# Patient Record
Sex: Male | Born: 1965 | Race: White | Hispanic: No | Marital: Married | Smoking: Current every day smoker
Health system: Southern US, Community
[De-identification: ages and names within clinical notes are randomized; demographics above are authoritative.]

## PROBLEM LIST (undated history)

## (undated) DIAGNOSIS — E785 Hyperlipidemia, unspecified: Secondary | ICD-10-CM

## (undated) DIAGNOSIS — I739 Peripheral vascular disease, unspecified: Secondary | ICD-10-CM

## (undated) DIAGNOSIS — G8929 Other chronic pain: Secondary | ICD-10-CM

## (undated) DIAGNOSIS — J45909 Unspecified asthma, uncomplicated: Secondary | ICD-10-CM

## (undated) DIAGNOSIS — M549 Dorsalgia, unspecified: Secondary | ICD-10-CM

## (undated) DIAGNOSIS — M797 Fibromyalgia: Secondary | ICD-10-CM

## (undated) DIAGNOSIS — G709 Myoneural disorder, unspecified: Secondary | ICD-10-CM

## (undated) DIAGNOSIS — M1990 Unspecified osteoarthritis, unspecified site: Secondary | ICD-10-CM

## (undated) HISTORY — DX: Unspecified asthma, uncomplicated: J45.909

## (undated) HISTORY — PX: TONSILLECTOMY: SUR1361

## (undated) HISTORY — DX: Unspecified osteoarthritis, unspecified site: M19.90

## (undated) HISTORY — DX: Myoneural disorder, unspecified: G70.9

## (undated) HISTORY — DX: Hyperlipidemia, unspecified: E78.5

## (undated) HISTORY — DX: Fibromyalgia: M79.7

## (undated) HISTORY — DX: Peripheral vascular disease, unspecified: I73.9

---

## 2015-09-06 DIAGNOSIS — E785 Hyperlipidemia, unspecified: Secondary | ICD-10-CM

## 2015-09-06 HISTORY — DX: Hyperlipidemia, unspecified: E78.5

## 2016-12-09 DIAGNOSIS — Z79899 Other long term (current) drug therapy: Secondary | ICD-10-CM | POA: Diagnosis not present

## 2016-12-09 DIAGNOSIS — M797 Fibromyalgia: Secondary | ICD-10-CM | POA: Diagnosis not present

## 2016-12-09 DIAGNOSIS — E785 Hyperlipidemia, unspecified: Secondary | ICD-10-CM | POA: Diagnosis not present

## 2016-12-09 DIAGNOSIS — I739 Peripheral vascular disease, unspecified: Secondary | ICD-10-CM

## 2016-12-09 HISTORY — DX: Peripheral vascular disease, unspecified: I73.9

## 2016-12-11 DIAGNOSIS — I739 Peripheral vascular disease, unspecified: Secondary | ICD-10-CM | POA: Diagnosis not present

## 2016-12-22 ENCOUNTER — Other Ambulatory Visit: Payer: Self-pay

## 2016-12-22 DIAGNOSIS — R6889 Other general symptoms and signs: Secondary | ICD-10-CM

## 2016-12-22 DIAGNOSIS — I739 Peripheral vascular disease, unspecified: Secondary | ICD-10-CM

## 2016-12-25 ENCOUNTER — Encounter: Payer: Self-pay | Admitting: Vascular Surgery

## 2017-02-10 ENCOUNTER — Encounter: Payer: Self-pay | Admitting: Vascular Surgery

## 2017-02-18 ENCOUNTER — Encounter (HOSPITAL_COMMUNITY): Payer: BLUE CROSS/BLUE SHIELD

## 2017-02-18 ENCOUNTER — Encounter: Payer: BLUE CROSS/BLUE SHIELD | Admitting: Vascular Surgery

## 2017-02-19 ENCOUNTER — Ambulatory Visit (INDEPENDENT_AMBULATORY_CARE_PROVIDER_SITE_OTHER): Payer: BLUE CROSS/BLUE SHIELD | Admitting: Vascular Surgery

## 2017-02-19 ENCOUNTER — Ambulatory Visit (INDEPENDENT_AMBULATORY_CARE_PROVIDER_SITE_OTHER)
Admission: RE | Admit: 2017-02-19 | Discharge: 2017-02-19 | Disposition: A | Discharge status: Home / Self Care | Payer: BLUE CROSS/BLUE SHIELD | Source: Ambulatory Visit | Attending: Vascular Surgery | Admitting: Vascular Surgery

## 2017-02-19 ENCOUNTER — Encounter: Payer: Self-pay | Admitting: Vascular Surgery

## 2017-02-19 ENCOUNTER — Ambulatory Visit (HOSPITAL_COMMUNITY)
Admission: RE | Admit: 2017-02-19 | Discharge: 2017-02-19 | Disposition: A | Discharge status: Home / Self Care | Payer: BLUE CROSS/BLUE SHIELD | Source: Ambulatory Visit | Attending: Vascular Surgery | Admitting: Vascular Surgery

## 2017-02-19 ENCOUNTER — Other Ambulatory Visit: Payer: Self-pay

## 2017-02-19 VITALS — BP 112/71 | HR 53 | Temp 97.5°F | Resp 16 | Ht 71.0 in | Wt 159.0 lb

## 2017-02-19 DIAGNOSIS — I739 Peripheral vascular disease, unspecified: Secondary | ICD-10-CM

## 2017-02-19 DIAGNOSIS — R6889 Other general symptoms and signs: Secondary | ICD-10-CM

## 2017-02-19 DIAGNOSIS — I771 Stricture of artery: Secondary | ICD-10-CM | POA: Insufficient documentation

## 2017-02-19 LAB — VAS US LOWER EXTREMITY ARTERIAL DUPLEX
LPTIBDISTSYS: 20 cm/s
Left ant tibial distal sys: 26 cm/s
Left super femoral dist sys PSV: -75 cm/s
Left super femoral mid sys PSV: 88 cm/s
Left super femoral prox sys PSV: -68 cm/s
RATIBDISTSYS: 14 cm/s
RIGHT POST TIB DIST SYS: 14 cm/s
RSFDPSV: -30 cm/s
RSFMPSV: 572 cm/s
Right super femoral prox sys PSV: -55 cm/s

## 2017-02-19 NOTE — Progress Notes (Addendum)
Referring Physician: Eunice BlaseGreta O'Buch PA  [name] Mccrackin MRN: [id number] DOB: [birth year] Sex: male  REASON FOR CONSULT: Bilateral leg pain  HPI: Tommy Larson Shippy is a 51 y.o. male with a 3 year history of bilateral lower extremity cramping type pain in the calf. Patient states his symptoms are brought on by walking. After he walks about 100 feet he develops this pain. It is relieved after about 5 minutes of rest. He currently smokes 1 pack of cigarettes per day. Greater than 3 minutes today were spent regarding smoking cessation counseling. He states that he is discussing with his primary care physician the possibility of starting Chantix but has not decided to start this yet. He currently is not on aspirin. He denies rest pain in his feet. He has no history of nonhealing wounds. He is on Lipitor.  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  Past Medical History:  Diagnosis Date  . Arthritis   . Asthma   . Claudication (HCC) 12/09/2016   Bilateral LE  . Dyslipidemia 09/06/2015  . Fibromyalgia   . Neuromuscular disorder (HCC)    neuropathy   No past surgical history on file.  Family History  Problem Relation Age of Onset  . Asthma Mother   . Arthritis Mother   . Arthritis Sister     SOCIAL HISTORY: Social History   Social History  . Marital status: Unknown    Spouse name: N/A  . Number of children: N/A  . Years of education: N/A   Occupational History  . Not on file.   Social History Main Topics  . Smoking status: Current Every Day Smoker    Packs/day: 1.00  . Smokeless tobacco: Never Used  . Alcohol use No  . Drug use: No  . Sexual activity: Not on file   Other Topics Concern  . Not on file   Social History Narrative  . No narrative on file    No Known Allergies  Current Outpatient Prescriptions  Medication Sig Dispense Refill  . atorvastatin (LIPITOR) 20 MG tablet Take 20 mg by mouth  daily.    Marland Kitchen. gabapentin (NEURONTIN) 600 MG tablet Take 600 mg by mouth 3 (three) times daily.    . meloxicam (MOBIC) 15 MG tablet Take 15 mg by mouth daily.    . traMADol (ULTRAM) 50 MG tablet Take by mouth every 6 (six) hours as needed.     No current facility-administered medications for this visit.     ROS:   General:  No weight loss, Fever, chills  HEENT: No recent headaches, no nasal bleeding, no visual changes, no sore throat  Neurologic: No dizziness, blackouts, seizures. No recent symptoms of stroke or mini- stroke. No recent episodes of slurred speech, or temporary blindness.  Cardiac: No recent episodes of chest pain/pressure, no shortness of breath at rest.  No shortness of breath with exertion.  Denies history of atrial fibrillation or irregular heartbeat  Vascular: No history of rest pain in feet.  + history of claudication.  No history of non-healing ulcer, No history of DVT   Pulmonary: No home oxygen, no productive cough, no hemoptysis,  No asthma or wheezing  Musculoskeletal:  [ ]  Arthritis, [ ]  Low back pain,  [ ]  Joint pain  Hematologic:No history of hypercoagulable state.  No history of easy bleeding.  No history of anemia  Gastrointestinal: No hematochezia or melena,  No gastroesophageal reflux,  no trouble swallowing  Urinary: [ ]  chronic Kidney disease, [ ]  on HD - [ ]  MWF or [ ]  TTHS, [ ]  Burning with urination, [ ]  Frequent urination, [ ]  Difficulty urinating;   Skin: No rashes  Psychological: No history of anxiety,  No history of depression   Physical Examination  Vitals:   02/19/17 0943  BP: 112/71  Pulse: (!) 53  Resp: 16  Temp: (!) 97.5 F (36.4 C)  TempSrc: Oral  SpO2: 97%  Weight: 159 lb (72.1 kg)  Height: 5\' 11"  (1.803 m)    Body mass index is 22.18 kg/m.  General:  Alert and oriented, no acute distress HEENT: Normal Neck: No bruit or JVD Pulmonary: Clear to auscultation bilaterally Cardiac: Regular Rate and Rhythm without  murmur Abdomen: Soft, non-tender, non-distended, no mass Skin: No rash Extremity Pulses:  2+ radial, brachial,1+ femoral, absent popliteal dorsalis pedis, posterior tibial pulses bilaterally Musculoskeletal: No deformity or edema  Neurologic: Upper and lower extremity motor 5/5 and symmetric  DATA:  Patient had [facility]. We have sent out to get those results but they were not available for review today. The patient had lower extremity duplex exam performed at our office today. This showed monophasic waveforms diffusely in both legs. He had a right external iliac artery stenosis. He had mild stenosis of the left iliac system as well. He also had evidence of a mid right superficial femoral artery stenosis.  ASSESSMENT:  Bilateral lower extremity claudication. Discussed the patient today walking program of 30 minutes daily. Also discussed with him today smoking cessation. I emphasized to him that if he is able to quit smoking his lifetime risk of amputation is less than 5%. However I did emphasize to him that if he continues to smoke he would be at risk of limb loss long-term. I also discussed with him the possibility of aortogram lower extremity runoff possible intervention to improve his symptoms. I did discuss with him that this would be for lifestyle related problems and that he currently is not at risk of limb loss. Risks benefits possible complications and procedure details including but not limited to bleeding infection vessel injury contrast reaction were explained the patient today. He understands and wishes to proceed with arteriography because he feels like he is unable to perform his job as well by not being able to walk certain distances.   PLAN:  #1 Will add aspirin daily to patient's medication regimen. Emphasized to him that this is to prevent myocardial infarction or stroke with the presence of peripheral arterial disease.  Again emphasized the  patient to quit smoking. I offered to write a prescription for Chantix today but he wishes to think about this more for now.  Abdominal aortogram lower extremity runoff possible intervention scheduled for 02/27/2017. Patient understands that if his occlusive disease is extensive and not amenable to angioplasty or stenting he may need to consider operative repair which will be scheduled at an additional date   Fabienne Brunsharles Fields, MD [facility] Office: [phone] Pager: [phone]  Addendum: 02/27/17  0.8 bilateral suggestive of iliac inflow disease from [facility]

## 2017-02-23 ENCOUNTER — Encounter: Payer: Self-pay | Admitting: Internal Medicine

## 2017-02-27 ENCOUNTER — Encounter (HOSPITAL_COMMUNITY): Payer: Self-pay | Admitting: *Deleted

## 2017-02-27 ENCOUNTER — Encounter (HOSPITAL_COMMUNITY)
Admission: RE | Disposition: A | Discharge status: Home / Self Care | Payer: Self-pay | Source: Ambulatory Visit | Attending: Vascular Surgery

## 2017-02-27 ENCOUNTER — Observation Stay (HOSPITAL_COMMUNITY)
Admission: RE | Admit: 2017-02-27 | Discharge: 2017-02-28 | Disposition: A | Discharge status: Home / Self Care | Payer: BLUE CROSS/BLUE SHIELD | Source: Ambulatory Visit | Attending: Vascular Surgery | Admitting: Vascular Surgery

## 2017-02-27 ENCOUNTER — Other Ambulatory Visit: Payer: Self-pay | Admitting: *Deleted

## 2017-02-27 ENCOUNTER — Ambulatory Visit (HOSPITAL_COMMUNITY): Payer: BLUE CROSS/BLUE SHIELD

## 2017-02-27 DIAGNOSIS — G8929 Other chronic pain: Secondary | ICD-10-CM | POA: Insufficient documentation

## 2017-02-27 DIAGNOSIS — Y838 Other surgical procedures as the cause of abnormal reaction of the patient, or of later complication, without mention of misadventure at the time of the procedure: Secondary | ICD-10-CM | POA: Diagnosis not present

## 2017-02-27 DIAGNOSIS — I97638 Postprocedural hematoma of a circulatory system organ or structure following other circulatory system procedure: Secondary | ICD-10-CM | POA: Diagnosis not present

## 2017-02-27 DIAGNOSIS — M1990 Unspecified osteoarthritis, unspecified site: Secondary | ICD-10-CM | POA: Insufficient documentation

## 2017-02-27 DIAGNOSIS — F17210 Nicotine dependence, cigarettes, uncomplicated: Secondary | ICD-10-CM | POA: Insufficient documentation

## 2017-02-27 DIAGNOSIS — M797 Fibromyalgia: Secondary | ICD-10-CM | POA: Diagnosis not present

## 2017-02-27 DIAGNOSIS — G629 Polyneuropathy, unspecified: Secondary | ICD-10-CM | POA: Diagnosis not present

## 2017-02-27 DIAGNOSIS — E785 Hyperlipidemia, unspecified: Secondary | ICD-10-CM | POA: Diagnosis not present

## 2017-02-27 DIAGNOSIS — I739 Peripheral vascular disease, unspecified: Secondary | ICD-10-CM | POA: Diagnosis present

## 2017-02-27 DIAGNOSIS — Z7982 Long term (current) use of aspirin: Secondary | ICD-10-CM | POA: Diagnosis not present

## 2017-02-27 DIAGNOSIS — I70213 Atherosclerosis of native arteries of extremities with intermittent claudication, bilateral legs: Principal | ICD-10-CM | POA: Insufficient documentation

## 2017-02-27 DIAGNOSIS — R1900 Intra-abdominal and pelvic swelling, mass and lump, unspecified site: Secondary | ICD-10-CM | POA: Diagnosis not present

## 2017-02-27 DIAGNOSIS — J45909 Unspecified asthma, uncomplicated: Secondary | ICD-10-CM | POA: Diagnosis not present

## 2017-02-27 DIAGNOSIS — M549 Dorsalgia, unspecified: Secondary | ICD-10-CM | POA: Insufficient documentation

## 2017-02-27 HISTORY — PX: ILIAC ARTERY STENT: SHX1786

## 2017-02-27 HISTORY — DX: Peripheral vascular disease, unspecified: I73.9

## 2017-02-27 HISTORY — PX: ABDOMINAL AORTOGRAM W/LOWER EXTREMITY: CATH118223

## 2017-02-27 HISTORY — DX: Dorsalgia, unspecified: M54.9

## 2017-02-27 HISTORY — PX: PERIPHERAL VASCULAR INTERVENTION: CATH118257

## 2017-02-27 HISTORY — DX: Other chronic pain: G89.29

## 2017-02-27 LAB — POCT ACTIVATED CLOTTING TIME: Activated Clotting Time: 153 seconds

## 2017-02-27 LAB — CBC
HEMATOCRIT: 38.8 % — AB (ref 39.0–52.0)
HEMOGLOBIN: 13.7 g/dL (ref 13.0–17.0)
MCH: 32.5 pg (ref 26.0–34.0)
MCHC: 35.3 g/dL (ref 30.0–36.0)
MCV: 92.2 fL (ref 78.0–100.0)
Platelets: 186 10*3/uL (ref 150–400)
RBC: 4.21 MIL/uL — AB (ref 4.22–5.81)
RDW: 13.8 % (ref 11.5–15.5)
WBC: 15.4 10*3/uL — ABNORMAL HIGH (ref 4.0–10.5)

## 2017-02-27 LAB — POCT I-STAT, CHEM 8
BUN: 16 mg/dL (ref 6–20)
CALCIUM ION: 1.14 mmol/L — AB (ref 1.15–1.40)
CHLORIDE: 104 mmol/L (ref 101–111)
CREATININE: 0.8 mg/dL (ref 0.61–1.24)
GLUCOSE: 103 mg/dL — AB (ref 65–99)
HCT: 42 % (ref 39.0–52.0)
Hemoglobin: 14.3 g/dL (ref 13.0–17.0)
Potassium: 4 mmol/L (ref 3.5–5.1)
SODIUM: 139 mmol/L (ref 135–145)
TCO2: 26 mmol/L (ref 22–32)

## 2017-02-27 LAB — TYPE AND SCREEN
ABO/RH(D): A POS
Antibody Screen: NEGATIVE

## 2017-02-27 LAB — ABO/RH: ABO/RH(D): A POS

## 2017-02-27 SURGERY — ABDOMINAL AORTOGRAM W/LOWER EXTREMITY
Anesthesia: LOCAL

## 2017-02-27 MED ORDER — CLOPIDOGREL BISULFATE 75 MG PO TABS: 75.0000 mg | ORAL_TABLET | Freq: Every day | ORAL | Status: DC

## 2017-02-27 MED ORDER — IODIXANOL 320 MG/ML IV SOLN
INTRAVENOUS | Status: DC | PRN
  Administered 2017-02-27: 170 mL via INTRA_ARTERIAL

## 2017-02-27 MED ORDER — PNEUMOCOCCAL VAC POLYVALENT 25 MCG/0.5ML IJ INJ: 0.5000 mL | INJECTION | INTRAMUSCULAR | Status: DC

## 2017-02-27 MED ORDER — HEPARIN (PORCINE) IN NACL 2-0.9 UNIT/ML-% IJ SOLN
INTRAMUSCULAR | Status: AC | PRN
  Administered 2017-02-27: 1000 mL

## 2017-02-27 MED ORDER — CLOPIDOGREL BISULFATE 75 MG PO TABS: 75.0000 mg | ORAL_TABLET | Freq: Every day | ORAL | 11 refills | Status: DC

## 2017-02-27 MED ORDER — SODIUM CHLORIDE 0.9 % IV SOLN
INTRAVENOUS | Status: DC
  Administered 2017-02-27: 08:00:00 via INTRAVENOUS

## 2017-02-27 MED ORDER — ALUM & MAG HYDROXIDE-SIMETH 200-200-20 MG/5ML PO SUSP
15.0000 mL | ORAL | Status: DC | PRN
  Filled 2017-02-27: qty 30

## 2017-02-27 MED ORDER — ONDANSETRON HCL 4 MG/2ML IJ SOLN: 4.0000 mg | Freq: Four times a day (QID) | INTRAMUSCULAR | Status: DC | PRN

## 2017-02-27 MED ORDER — LABETALOL HCL 5 MG/ML IV SOLN: 10.0000 mg | INTRAVENOUS | Status: DC | PRN

## 2017-02-27 MED ORDER — HYDRALAZINE HCL 20 MG/ML IJ SOLN: 5.0000 mg | INTRAMUSCULAR | Status: DC | PRN

## 2017-02-27 MED ORDER — POTASSIUM CHLORIDE CRYS ER 20 MEQ PO TBCR: 20.0000 meq | EXTENDED_RELEASE_TABLET | Freq: Once | ORAL | Status: DC

## 2017-02-27 MED ORDER — FENTANYL CITRATE (PF) 100 MCG/2ML IJ SOLN
INTRAMUSCULAR | Status: AC
  Filled 2017-02-27: qty 2

## 2017-02-27 MED ORDER — SODIUM CHLORIDE 0.9 % IV SOLN: 250.0000 mL | INTRAVENOUS | Status: DC | PRN

## 2017-02-27 MED ORDER — SODIUM CHLORIDE 0.9% FLUSH: 3.0000 mL | INTRAVENOUS | Status: DC | PRN

## 2017-02-27 MED ORDER — SODIUM CHLORIDE 0.9 % IV SOLN: INTRAVENOUS | Status: AC

## 2017-02-27 MED ORDER — METOPROLOL TARTRATE 5 MG/5ML IV SOLN: 2.0000 mg | INTRAVENOUS | Status: DC | PRN

## 2017-02-27 MED ORDER — GUAIFENESIN-DM 100-10 MG/5ML PO SYRP
15.0000 mL | ORAL_SOLUTION | ORAL | Status: DC | PRN
  Filled 2017-02-27: qty 15

## 2017-02-27 MED ORDER — MORPHINE SULFATE (PF) 2 MG/ML IV SOLN: 2.0000 mg | INTRAVENOUS | Status: DC | PRN

## 2017-02-27 MED ORDER — OXYCODONE-ACETAMINOPHEN 5-325 MG PO TABS
1.0000 | ORAL_TABLET | ORAL | Status: DC | PRN
  Administered 2017-02-27 – 2017-02-28 (×2): 1 via ORAL
  Filled 2017-02-27: qty 1

## 2017-02-27 MED ORDER — ASPIRIN EC 325 MG PO TBEC
325.0000 mg | DELAYED_RELEASE_TABLET | Freq: Every day | ORAL | Status: DC
  Administered 2017-02-28: 325 mg via ORAL
  Filled 2017-02-27 (×2): qty 1

## 2017-02-27 MED ORDER — OXYCODONE HCL 5 MG PO TABS: 5.0000 mg | ORAL_TABLET | ORAL | Status: DC | PRN

## 2017-02-27 MED ORDER — HEPARIN SODIUM (PORCINE) 1000 UNIT/ML IJ SOLN
INTRAMUSCULAR | Status: AC
  Filled 2017-02-27: qty 1

## 2017-02-27 MED ORDER — OXYCODONE-ACETAMINOPHEN 5-325 MG PO TABS
ORAL_TABLET | ORAL | Status: AC
  Filled 2017-02-27: qty 1

## 2017-02-27 MED ORDER — CLOPIDOGREL BISULFATE 300 MG PO TABS: 300.0000 mg | ORAL_TABLET | Freq: Once | ORAL | Status: DC

## 2017-02-27 MED ORDER — SODIUM CHLORIDE 0.9 % IV SOLN: Freq: Once | INTRAVENOUS | Status: DC

## 2017-02-27 MED ORDER — TETRACAINE HCL 1 % IJ SOLN
150.0000 mg | Freq: Once | INTRAMUSCULAR | Status: AC
  Administered 2017-02-27: 15 mL
  Filled 2017-02-27: qty 16

## 2017-02-27 MED ORDER — HEPARIN SODIUM (PORCINE) 1000 UNIT/ML IJ SOLN
INTRAMUSCULAR | Status: DC | PRN
  Administered 2017-02-27: 7000 [IU] via INTRAVENOUS

## 2017-02-27 MED ORDER — SODIUM CHLORIDE 0.9% FLUSH
3.0000 mL | Freq: Two times a day (BID) | INTRAVENOUS | Status: DC
  Administered 2017-02-27: 3 mL via INTRAVENOUS

## 2017-02-27 MED ORDER — MIDAZOLAM HCL 2 MG/2ML IJ SOLN
INTRAMUSCULAR | Status: DC | PRN
  Administered 2017-02-27: 1 mg via INTRAVENOUS

## 2017-02-27 MED ORDER — FENTANYL CITRATE (PF) 100 MCG/2ML IJ SOLN
INTRAMUSCULAR | Status: DC | PRN
  Administered 2017-02-27: 25 ug via INTRAVENOUS

## 2017-02-27 MED ORDER — HEPARIN (PORCINE) IN NACL 2-0.9 UNIT/ML-% IJ SOLN
INTRAMUSCULAR | Status: AC
  Filled 2017-02-27: qty 500

## 2017-02-27 MED ORDER — CLOPIDOGREL BISULFATE 75 MG PO TABS
75.0000 mg | ORAL_TABLET | Freq: Every day | ORAL | Status: DC
  Administered 2017-02-28: 75 mg via ORAL
  Filled 2017-02-27: qty 1

## 2017-02-27 MED ORDER — MORPHINE SULFATE (PF) 10 MG/ML IV SOLN: 2.0000 mg | INTRAVENOUS | Status: DC | PRN

## 2017-02-27 MED ORDER — PHENOL 1.4 % MT LIQD
1.0000 | OROMUCOSAL | Status: DC | PRN
  Filled 2017-02-27: qty 177

## 2017-02-27 MED ORDER — MIDAZOLAM HCL 2 MG/2ML IJ SOLN
INTRAMUSCULAR | Status: AC
  Filled 2017-02-27: qty 2

## 2017-02-27 MED ORDER — PANTOPRAZOLE SODIUM 40 MG PO TBEC
40.0000 mg | DELAYED_RELEASE_TABLET | Freq: Every day | ORAL | Status: DC
  Administered 2017-02-27 – 2017-02-28 (×2): 40 mg via ORAL
  Filled 2017-02-27 (×2): qty 1

## 2017-02-27 SURGICAL SUPPLY — 17 items
BALLN ARMADA 6X40X80 (BALLOONS) ×3
BALLOON ARMADA 6X40X80 (BALLOONS) ×2 IMPLANT
CATH CROSS OVER TEMPO 5F (CATHETERS) ×3 IMPLANT
CATH OMNI FLUSH 5F 65CM (CATHETERS) ×6 IMPLANT
COVER PRB 48X5XTLSCP FOLD TPE (BAG) ×2 IMPLANT
COVER PROBE 5X48 (BAG) ×1
GUIDEWIRE ANGLED .035X150CM (WIRE) ×3 IMPLANT
KIT ENCORE 26 ADVANTAGE (KITS) ×3 IMPLANT
KIT PV (KITS) ×3 IMPLANT
SHEATH PINNACLE 5F 10CM (SHEATH) ×3 IMPLANT
SHEATH PINNACLE ST 7F 45CM (SHEATH) ×3 IMPLANT
STENT ABSOLUTE PRO 6X40X135 (Permanent Stent) ×3 IMPLANT
SYR MEDRAD MARK V 150ML (SYRINGE) ×3 IMPLANT
TRANSDUCER W/STOPCOCK (MISCELLANEOUS) ×3 IMPLANT
TRAY PV CATH (CUSTOM PROCEDURE TRAY) ×3 IMPLANT
WIRE HITORQ VERSACORE ST 145CM (WIRE) ×3 IMPLANT
WIRE ROSEN-J .035X260CM (WIRE) ×3 IMPLANT

## 2017-02-27 NOTE — Op Note (Signed)
Procedure: Abdominal aortogram with bilateral lower extremity runoff, left external iliac stent (self-expanding 6 x 40 mm)  Preoperative diagnosis: Claudication. 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 The patient placed in supine position the Angio table. Both groins were prepped and draped in usual sterile fashion. Ultrasound was used to identify the right common femoral artery. Tetracaine was injected in the subcutaneous tissues to provide local anesthesia since the patient had previously had a side effect from lidocaine. After injecting local anesthesia ultrasound was used to cannulate the right common femoral artery with an introducer needle. An 035 versacore wire was then threaded up the abdominal aorta under fluoroscopic guidance. A 5 French sheath was divided guidewire the right common femoral artery. This was thoroughly flushed with heparinized saline. A 5 French Omni Flush catheter was advanced over the guidewire into the abdominal aorta. An abdominal aortogram was obtained in AP projection. The left and right renal arteries are widely patent. The infrarenal bowel aortic widely patent. The left and right common iliac arteries are widely patent. The left internal iliac artery is occluded. The right internal iliac artery is patent. The right external iliac artery has a 70% narrowing just above the inguinal ligament. There are similar findings in the left external iliac artery. These findings were confirmed by performing bilateral oblique views of the pelvis  after pulling the catheter down just above the aortic bifurcation.  At this point bilateral extremity runoff views were obtained.  In the left lower externa, the left common femoral artery profunda femoris and superficial femoral arteries are widely patent. There is a 90% stenosis over less than 1 cm length in the left popliteal artery. There is three-vessel runoff to the left foot.  In the right lower extremity, the right common femoral artery is widely patent. The right profunda femoris and superficial femoral arteries are patent. However there is a 90% stenosis in the distal right SFA over less than 1; length. The popliteal artery is patent. There is three-vessel runoff to the right foot.  At this point it was decided to intervene on the left external iliac artery lesion. A 5 French crossover catheter was placed over the versacore wire and this was then used to selectively catheterize the left common iliac artery. An 035 angled Glidewire was advanced across the lesion the patient was given 7000 units of intravenous heparin. The Glidewire was advanced down into the proximal left superficial femoral artery. The crossover catheter was then advanced over this. The Glidewire was then exchanged for an [place] wire. A 7 French destination sheath was then advanced up and over the aortic bifurcation into the distal left common iliac artery. Contrast angiogram was performed to confirm the level of the stent as well as make diameter and length measurements. A 6 x 40 mm self expanding stent was selected and this was deployed at the area of narrowing with the distal aspect of the stent extending down to but not encroaching upon the acetabulum area of the hip. This was then postdilated with a 640 balloon to 6 atm for 1 minute. Completion arteriogram showed widely patent  left external iliac artery with 0 residual stenosis. At this point the sheath was pulled back over the aortic bifurcation into the right hemipelvis  left in place to be pulled after the ACT is less than 175. The patient tolerated the procedure well and there were no complications.  Operative management: The patient was currently artery on aspirin. Plavix will be added to this regimen. Patient has a 70% stenosis on the right external iliac artery but we did not have enough room to intervene on this today because of the sheath placement on the right side. The patient will be brought back in a few weeks for left femoral puncture and right external iliac artery stenting. At that point we will also make a decision on whether or not to intervene on the right SFA stenosis.  Ruta Hinds, MD [facility] [place] Office: [phone] Pager: [phone]

## 2017-02-27 NOTE — Discharge Instructions (Signed)
Angiogram, Care After °This sheet gives you information about how to care for yourself after your procedure. Your health care provider may also give you more specific instructions. If you have problems or questions, contact your health care provider. °What can I expect after the procedure? °After the procedure, it is common to have bruising and tenderness at the catheter insertion area. °Follow these instructions at home: °Insertion site care  °· Follow instructions from your health care provider about how to take care of your insertion site. Make sure you: °¨ Wash your hands with soap and water before you change your bandage (dressing). If soap and water are not available, use hand sanitizer. °¨ Change your dressing as told by your health care provider. °¨ Leave stitches (sutures), skin glue, or adhesive strips in place. These skin closures may need to stay in place for 2 weeks or longer. If adhesive strip edges start to loosen and curl up, you may trim the loose edges. Do not remove adhesive strips completely unless your health care provider tells you to do that. °· Do not take baths, swim, or use a hot tub until your health care provider approves. °· You may shower 24-48 hours after the procedure or as told by your health care provider. °¨ Gently wash the site with plain soap and water. °¨ Pat the area dry with a clean towel. °¨ Do not rub the site. This may cause bleeding. °· Do not apply powder or lotion to the site. Keep the site clean and dry. °· Check your insertion site every day for signs of infection. Check for: °¨ Redness, swelling, or pain. °¨ Fluid or blood. °¨ Warmth. °¨ Pus or a bad smell. °Activity  °· Rest as told by your health care provider, usually for 1-2 days. °· Do not lift anything that is heavier than 10 lbs. (4.5 kg) or as told by your health care provider. °· Do not drive for 24 hours if you were given a medicine to help you relax (sedative). °· Do not drive or use heavy machinery while  taking prescription pain medicine. °General instructions  °· Return to your normal activities as told by your health care provider, usually in about a week. Ask your health care provider what activities are safe for you. °· If the catheter site starts bleeding, lie flat and put pressure on the site. If the bleeding does not stop, get help right away. This is a medical emergency. °· Drink enough fluid to keep your urine clear or pale yellow. This helps flush the contrast dye from your body. °· Take over-the-counter and prescription medicines only as told by your health care provider. °· Keep all follow-up visits as told by your health care provider. This is important. °Contact a health care provider if: °· You have a fever or chills. °· You have redness, swelling, or pain around your insertion site. °· You have fluid or blood coming from your insertion site. °· The insertion site feels warm to the touch. °· You have pus or a bad smell coming from your insertion site. °· You have bruising around the insertion site. °· You notice blood collecting in the tissue around the catheter site (hematoma). The hematoma may be painful to the touch. °Get help right away if: °· You have severe pain at the catheter insertion area. °· The catheter insertion area swells very fast. °· The catheter insertion area is bleeding, and the bleeding does not stop when you hold steady pressure on   the area. °· The area near or just beyond the catheter insertion site becomes pale, cool, tingly, or numb. °These symptoms may represent a serious problem that is an emergency. Do not wait to see if the symptoms will go away. Get medical help right away. Call your local emergency services (911 in the [place] Do not drive yourself to the hospital. °Summary °· After the procedure, it is common to have bruising and tenderness at the catheter insertion area. °· After the procedure, it is important to rest and drink plenty of fluids. °· Do not take baths,  swim, or use a hot tub until your health care provider says it is okay to do so. You may shower 24-48 hours after the procedure or as told by your health care provider. °· If the catheter site starts bleeding, lie flat and put pressure on the site. If the bleeding does not stop, get help right away. This is a medical emergency. °This information is not intended to replace advice given to you by your health care provider. Make sure you discuss any questions you have with your health care provider. °Document Released: 01/09/2005 Document Revised: 05/28/2016 Document Reviewed: 05/28/2016 °Elsevier Interactive Patient Education © 2017 [employer] ° °

## 2017-02-27 NOTE — H&P (View-Only) (Signed)
Referring Physician: Eunice BlaseGreta O'Buch PA  [name] Mccrackin MRN: [id number] DOB: [birth year] Sex: male  REASON FOR CONSULT: Bilateral leg pain  HPI: Tommy Larson Shippy is a 51 y.o. male with a 3 year history of bilateral lower extremity cramping type pain in the calf. Patient states his symptoms are brought on by walking. After he walks about 100 feet he develops this pain. It is relieved after about 5 minutes of rest. He currently smokes 1 pack of cigarettes per day. Greater than 3 minutes today were spent regarding smoking cessation counseling. He states that he is discussing with his primary care physician the possibility of starting Chantix but has not decided to start this yet. He currently is not on aspirin. He denies rest pain in his feet. He has no history of nonhealing wounds. He is on Lipitor.  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  Past Medical History:  Diagnosis Date  . Arthritis   . Asthma   . Claudication (HCC) 12/09/2016   Bilateral LE  . Dyslipidemia 09/06/2015  . Fibromyalgia   . Neuromuscular disorder (HCC)    neuropathy   No past surgical history on file.  Family History  Problem Relation Age of Onset  . Asthma Mother   . Arthritis Mother   . Arthritis Sister     SOCIAL HISTORY: Social History   Social History  . Marital status: Unknown    Spouse name: N/A  . Number of children: N/A  . Years of education: N/A   Occupational History  . Not on file.   Social History Main Topics  . Smoking status: Current Every Day Smoker    Packs/day: 1.00  . Smokeless tobacco: Never Used  . Alcohol use No  . Drug use: No  . Sexual activity: Not on file   Other Topics Concern  . Not on file   Social History Narrative  . No narrative on file    No Known Allergies  Current Outpatient Prescriptions  Medication Sig Dispense Refill  . atorvastatin (LIPITOR) 20 MG tablet Take 20 mg by mouth  daily.    Marland Kitchen. gabapentin (NEURONTIN) 600 MG tablet Take 600 mg by mouth 3 (three) times daily.    . meloxicam (MOBIC) 15 MG tablet Take 15 mg by mouth daily.    . traMADol (ULTRAM) 50 MG tablet Take by mouth every 6 (six) hours as needed.     No current facility-administered medications for this visit.     ROS:   General:  No weight loss, Fever, chills  HEENT: No recent headaches, no nasal bleeding, no visual changes, no sore throat  Neurologic: No dizziness, blackouts, seizures. No recent symptoms of stroke or mini- stroke. No recent episodes of slurred speech, or temporary blindness.  Cardiac: No recent episodes of chest pain/pressure, no shortness of breath at rest.  No shortness of breath with exertion.  Denies history of atrial fibrillation or irregular heartbeat  Vascular: No history of rest pain in feet.  + history of claudication.  No history of non-healing ulcer, No history of DVT   Pulmonary: No home oxygen, no productive cough, no hemoptysis,  No asthma or wheezing  Musculoskeletal:  [ ]  Arthritis, [ ]  Low back pain,  [ ]  Joint pain  Hematologic:No history of hypercoagulable state.  No history of easy bleeding.  No history of anemia  Gastrointestinal: No hematochezia or melena,  No gastroesophageal reflux,  no trouble swallowing  Urinary: [ ]  chronic Kidney disease, [ ]  on HD - [ ]  MWF or [ ]  TTHS, [ ]  Burning with urination, [ ]  Frequent urination, [ ]  Difficulty urinating;   Skin: No rashes  Psychological: No history of anxiety,  No history of depression   Physical Examination  Vitals:   02/19/17 0943  BP: 112/71  Pulse: (!) 53  Resp: 16  Temp: (!) 97.5 F (36.4 C)  TempSrc: Oral  SpO2: 97%  Weight: 159 lb (72.1 kg)  Height: 5\' 11"  (1.803 m)    Body mass index is 22.18 kg/m.  General:  Alert and oriented, no acute distress HEENT: Normal Neck: No bruit or JVD Pulmonary: Clear to auscultation bilaterally Cardiac: Regular Rate and Rhythm without  murmur Abdomen: Soft, non-tender, non-distended, no mass Skin: No rash Extremity Pulses:  2+ radial, brachial,1+ femoral, absent popliteal dorsalis pedis, posterior tibial pulses bilaterally Musculoskeletal: No deformity or edema  Neurologic: Upper and lower extremity motor 5/5 and symmetric  DATA:  Patient had bilateral ABIs performed at [facility]. We have sent out to get those results but they were not available for review today. The patient had lower extremity duplex exam performed at our office today. This showed monophasic waveforms diffusely in both legs. He had a right external iliac artery stenosis. He had mild stenosis of the left iliac system as well. He also had evidence of a mid right superficial femoral artery stenosis.  ASSESSMENT:  Bilateral lower extremity claudication. Discussed the patient today walking program of 30 minutes daily. Also discussed with him today smoking cessation. I emphasized to him that if he is able to quit smoking his lifetime risk of amputation is less than 5%. However I did emphasize to him that if he continues to smoke he would be at risk of limb loss long-term. I also discussed with him the possibility of aortogram lower extremity runoff possible intervention to improve his symptoms. I did discuss with him that this would be for lifestyle related problems and that he currently is not at risk of limb loss. Risks benefits possible complications and procedure details including but not limited to bleeding infection vessel injury contrast reaction were explained the patient today. He understands and wishes to proceed with arteriography because he feels like he is unable to perform his job as well by not being able to walk certain distances.   PLAN:  #1 Will add aspirin daily to patient's medication regimen. Emphasized to him that this is to prevent myocardial infarction or stroke with the presence of peripheral arterial disease.  Again emphasized the  patient to quit smoking. I offered to write a prescription for Chantix today but he wishes to think about this more for now.  Abdominal aortogram lower extremity runoff possible intervention scheduled for 02/27/2017. Patient understands that if his occlusive disease is extensive and not amenable to angioplasty or stenting he may need to consider operative repair which will be scheduled at an additional date   Fabienne Bruns, MD [facility] [place] Office: [phone] Pager: [phone]

## 2017-02-27 NOTE — Progress Notes (Signed)
Pt with scrotal ecchymosis and some fullness in right lower quadrant above stick site.  No hemodynamic instability per say but BP low 100s  Will hold pressure for 20 min to proceed for CT abd pelvis to rule out RP hematoma  Will need to be admitted  Discussed with pt and family  Fabienne Bruns, MD [facility] [place] Office: [phone] Pager: [phone]

## 2017-02-27 NOTE — Progress Notes (Signed)
Pt remains on bedrest. Site remains the same. VSS report called to 4E [place],RN.  Pt transported without incident to 4E19

## 2017-02-27 NOTE — Progress Notes (Signed)
Pt received from cath lab, Upon assessment of pt's groin site, pt noted to be boggy and tender above (R) groin site. Pressure held for 15 min. Upon further assessment ,pt noted to have very swollen testicles with echymosis at the scrotum and penis. Pt's BP in low 100s. Continued to hold pressure at the site.  Dr Darrick Penna notified and came to assess pt..Pt's wife and mother are at Bedside. Pt given a 500cc NS bolus. Stat T and S and CBC obtained. Stat CT scan ordered. Pt taken to CT by Selena Batten Councilmen,RTR monitored.

## 2017-02-27 NOTE — Progress Notes (Signed)
CT scan shows right groin hematoma mainly in scrotum.  He currently is not having expansion of this and remains stable.  Will admit for observation.  Most likely d/c am if no further issues.  Hgb 13.  Has sample in blood bank  Pt discussed with Dr Imogene Burn who is on call this weekend  Fabienne Bruns, MD [facility] [place] Office: [phone] Pager: [phone]

## 2017-02-27 NOTE — Progress Notes (Signed)
Patient arrived on the unit from cathlab, vital signs obtained, placed on tele ccmd notified, bed in lowest position call bell within reach, dressing on rt groin clean and dry, with large bruising extending to the scotum, no sign of distress noted, will continue to monitor.

## 2017-02-27 NOTE — Progress Notes (Signed)
25fr sheath aspirated and removed from rfa. Manual pressure applied for 20 minutes.  Patient dropped systolic blood pressure after sheath removal to 82/60 felt a bit of nausea.  0.9% fluids opened. Bolus of 125cc given. BP quickly returned to 97/65 and 104/69. Patient felt better. Hemostasis achieved, tegaderm dressing applied, bedrest instructions given.  Groin level 0 distal pulses present bilaterally with dopper.  Patient reminded to to lift his head from the pillow.  Bedrest begins at 13:20:00

## 2017-02-27 NOTE — Progress Notes (Signed)
Pt returned from CT scan /VSS Pt s scrotum has more bruising but size and firmness remain stable. Will continue to monitor

## 2017-02-27 NOTE — Interval H&P Note (Signed)
History and Physical Interval Note:  02/27/2017 8:45 AM  Tommy Larson  has presented today for surgery, with the diagnosis of pad  The various methods of treatment have been discussed with the patient and family. After consideration of risks, benefits and other options for treatment, the patient has consented to  Procedure(s): ABDOMINAL AORTOGRAM W/LOWER EXTREMITY (N/A) as a surgical intervention .  The patient's history has been reviewed, patient examined, no change in status, stable for surgery.  I have reviewed the patient's chart and labs.  Questions were answered to the patient's satisfaction.     Fabienne Bruns

## 2017-02-28 DIAGNOSIS — I97638 Postprocedural hematoma of a circulatory system organ or structure following other circulatory system procedure: Secondary | ICD-10-CM | POA: Diagnosis not present

## 2017-02-28 DIAGNOSIS — F17210 Nicotine dependence, cigarettes, uncomplicated: Secondary | ICD-10-CM | POA: Diagnosis not present

## 2017-02-28 DIAGNOSIS — G8929 Other chronic pain: Secondary | ICD-10-CM | POA: Diagnosis not present

## 2017-02-28 DIAGNOSIS — Z7982 Long term (current) use of aspirin: Secondary | ICD-10-CM | POA: Diagnosis not present

## 2017-02-28 DIAGNOSIS — I70213 Atherosclerosis of native arteries of extremities with intermittent claudication, bilateral legs: Secondary | ICD-10-CM | POA: Diagnosis not present

## 2017-02-28 DIAGNOSIS — M549 Dorsalgia, unspecified: Secondary | ICD-10-CM | POA: Diagnosis not present

## 2017-02-28 DIAGNOSIS — M797 Fibromyalgia: Secondary | ICD-10-CM | POA: Diagnosis not present

## 2017-02-28 DIAGNOSIS — J45909 Unspecified asthma, uncomplicated: Secondary | ICD-10-CM | POA: Diagnosis not present

## 2017-02-28 DIAGNOSIS — E785 Hyperlipidemia, unspecified: Secondary | ICD-10-CM | POA: Diagnosis not present

## 2017-02-28 DIAGNOSIS — Y838 Other surgical procedures as the cause of abnormal reaction of the patient, or of later complication, without mention of misadventure at the time of the procedure: Secondary | ICD-10-CM | POA: Diagnosis not present

## 2017-02-28 DIAGNOSIS — M1990 Unspecified osteoarthritis, unspecified site: Secondary | ICD-10-CM | POA: Diagnosis not present

## 2017-02-28 DIAGNOSIS — G629 Polyneuropathy, unspecified: Secondary | ICD-10-CM | POA: Diagnosis not present

## 2017-02-28 DIAGNOSIS — I739 Peripheral vascular disease, unspecified: Secondary | ICD-10-CM | POA: Diagnosis not present

## 2017-02-28 LAB — BASIC METABOLIC PANEL
ANION GAP: 8 (ref 5–15)
BUN: 12 mg/dL (ref 6–20)
CHLORIDE: 107 mmol/L (ref 101–111)
CO2: 25 mmol/L (ref 22–32)
Calcium: 9.1 mg/dL (ref 8.9–10.3)
Creatinine, Ser: 0.84 mg/dL (ref 0.61–1.24)
GFR calc Af Amer: >60 mL/min (ref >60)
GLUCOSE: 88 mg/dL (ref 65–99)
POTASSIUM: 4.2 mmol/L (ref 3.5–5.1)
Sodium: 140 mmol/L (ref 135–145)

## 2017-02-28 LAB — CBC
HEMATOCRIT: 37.9 % — AB (ref 39.0–52.0)
HEMOGLOBIN: 12.9 g/dL — AB (ref 13.0–17.0)
MCH: 31.2 pg (ref 26.0–34.0)
MCHC: 34 g/dL (ref 30.0–36.0)
MCV: 91.5 fL (ref 78.0–100.0)
Platelets: 200 10*3/uL (ref 150–400)
RBC: 4.14 MIL/uL — AB (ref 4.22–5.81)
RDW: 13.7 % (ref 11.5–15.5)
WBC: 10.9 10*3/uL — AB (ref 4.0–10.5)

## 2017-02-28 NOTE — Progress Notes (Signed)
Patient in a stable condition ,discharge education  completed with patient he verbalised understanding, patient belongings at bedside, tele dc ccmd notified, iv removed, patient's wife to transport patient home

## 2017-02-28 NOTE — Care Management Note (Signed)
Case Management Note  Patient Details  Name: Tommy Larson MRN: [id number] Date of Birth: 1965-07-17  Subjective/Objective:                 Patient with order to DC to home today. Chart reviewed. No Home Health or Equipment needs, no unacknowledged Case Management consults or medication needs identified at the time of this note. Patient starting Plavix, has coverage through [facility]. Plan for DC to home. If needs arise today prior to discharge, please call Lawerance Sabal RN CM at [phone].    Action/Plan:   Expected Discharge Date:  02/28/17               Expected Discharge Plan:  Home/Self Care  In-House Referral:     Discharge planning Services  CM Consult  Post Acute Care Choice:    Choice offered to:     DME Arranged:    DME Agency:     HH Arranged:    HH Agency:     Status of Service:  Completed, signed off  If discussed at [employer] of Stay Meetings, dates discussed:    Additional Comments:  Lawerance Sabal, RN 02/28/2017, 10:12 AM

## 2017-02-28 NOTE — Progress Notes (Addendum)
[facility] [place]  Subjective  - Doing well.  Ambulating , tolerating PO's and voided.   Objective 116/69 66 98.4 F (36.9 C) (Oral) (!) 22 93%  Intake/Output Summary (Last 24 hours) at 02/28/17 0758 Last data filed at 02/28/17 0500  Gross per 24 hour  Intake              120 ml  Output              500 ml  Net             -380 ml      Assessment/Planning: POD#1 Abdominal aortogram with bilateral lower extremity runoff, left external iliac stent (self-expanding 6 x 40 mm) Operative findings: #1 tubular 70% stenosis bilateral external iliac arteries #2 left external iliac artery stented from 70% stenosis to residual 0% stenosis #3 short segment 90% stenosis less than 1 cm length right superficial femoral artery #2 short segment less than 1 cm length 90% stenosis left popliteal artery, three-vessel runoff to the feet bilaterally  Operative management: The patient was currently artery on aspirin. Plavix will be added to this regimen. Patient has a 70% stenosis on the right external iliac artery but we did not have enough room to intervene on this today because of the sheath placement on the right side. The patient will be brought back in a few weeks for left femoral puncture and right external iliac artery stenting. At that point we will also make a decision on whether or not to intervene on the right SFA stenosis.  Post op course:  Pt with scrotal ecchymosis and some fullness in right lower quadrant above stick site.  No hemodynamic instability per say but BP low 100s   CT scan shows right groin hematoma mainly in scrotum.  He currently is not having expansion of this and remains stable.  Will admit for observation.  Most likely d/c am if no further issues.  HGB 10.9, no hematoma palpable at stick site. Disposition stable for discharge.  Clinton Gallant [facility] 02/28/2017 7:58 AM   Addendum  I have independently interviewed and examined the patient,  and I agree with the physician assistant's findings.  Stable scrotal hematoma and anterior wall hematoma.  H/H stable.  Hemodynamics stable.  Ok to D/C home  Leonides Sake, MD, FACS [facility] [place] Office: [phone] Pager: [phone]  02/28/2017, 9:23 AM   --  Laboratory Lab Results:  Recent Labs  02/27/17 1433 02/28/17 0304  WBC 15.4* 10.9*  HGB 13.7 12.9*  HCT 38.8* 37.9*  PLT 186 200   BMET  Recent Labs  02/27/17 0756 02/28/17 0304  NA 139 140  K 4.0 4.2  CL 104 107  CO2  --  25  GLUCOSE 103* 88  BUN 16 12  CREATININE 0.80 0.84  CALCIUM  --  9.1    COAG No results found for: INR, PROTIME No results found for: PTT

## 2017-03-02 ENCOUNTER — Encounter (HOSPITAL_COMMUNITY): Payer: Self-pay | Admitting: Vascular Surgery

## 2017-03-02 LAB — POCT ACTIVATED CLOTTING TIME: Activated Clotting Time: 257 seconds

## 2017-03-04 DIAGNOSIS — R1031 Right lower quadrant pain: Secondary | ICD-10-CM | POA: Diagnosis not present

## 2017-03-04 DIAGNOSIS — S7011XA Contusion of right thigh, initial encounter: Secondary | ICD-10-CM | POA: Diagnosis not present

## 2017-03-04 DIAGNOSIS — M79604 Pain in right leg: Secondary | ICD-10-CM | POA: Diagnosis not present

## 2017-03-04 NOTE — Discharge Summary (Signed)
[facility] Discharge Summary   Patient ID:  Tommy Larson MRN: [id number] DOB/AGE: [birth year] 52 y.o.  Admit date: 02/27/2017 Discharge date:02/28/2017 Date of Surgery: 02/27/2017 Surgeon: Surgeon(s): Darrick Penna Janetta Hora, MD  Admission Diagnosis: pad  Discharge Diagnoses:  pad  Secondary Diagnoses: Past Medical History:  Diagnosis Date  . Arthritis    "back" (02/27/2017)  . Asthma   . Chronic back pain    "all over" (02/27/2017)  . Claudication (HCC) 12/09/2016   Bilateral LE  . Dyslipidemia 09/06/2015  . Fibromyalgia   . Neuromuscular disorder (HCC)    neuropathy  . PAD (peripheral artery disease) (HCC)     Procedure(s): ABDOMINAL AORTOGRAM W/LOWER EXTREMITY PERIPHERAL VASCULAR INTERVENTION  Discharged Condition: good  HPI: Tommy Larson is a 51 y.o. male with a 3 year history of bilateral lower extremity cramping type pain in the calf. Patient states his symptoms are brought on by walking. After he walks about 100 feet he develops this pain. It is relieved after about 5 minutes of rest. He currently smokes 1 pack of cigarettes per day. Greater than 3 minutes today were spent regarding smoking cessation counseling. He states that he is discussing with his primary care physician the possibility of starting Chantix but has not decided to start this yet. He currently is not on aspirin. He denies rest pain in his feet. He has no history of nonhealing wounds. He is on Lipitor.  He works actively as a Passenger transport manager and states that the inability to walk has decreased his ability to work.   Hospital Course:  Tommy Larson is a 51 y.o. male is S/P  Procedure(s): ABDOMINAL AORTOGRAM W/LOWER EXTREMITY PERIPHERAL VASCULAR INTERVENTION Operative findings: #1 tubular 70% stenosis bilateral external iliac arteries #2 left external iliac artery stented from 70% stenosis to residual 0% stenosis #3 short segment 90% stenosis less than 1 cm length right superficial femoral artery #2  short segment less than 1 cm length 90% stenosis left popliteal artery, three-vessel runoff to the feet bilaterally.  Operative management: The patient was currently on aspirin. Plavix will be added to this regimen. Patient has a 70% stenosis on the right external iliac artery but we did not have enough room to intervene on this today because of the sheath placement on the right side. The patient will be brought back in a few weeks for left femoral puncture and right external iliac artery stenting. At that point we will also make a decision on whether or not to intervene on the right SFA stenosis.  Stable scrotal hematoma and anterior wall hematoma.  H/H stable.  Hemodynamics stable.  Ok to D/C home.   Significant Diagnostic Studies: CBC Lab Results  Component Value Date   WBC 10.9 (H) 02/28/2017   HGB 12.9 (L) 02/28/2017   HCT 37.9 (L) 02/28/2017   MCV 91.5 02/28/2017   PLT 200 02/28/2017    BMET    Component Value Date/Time   NA 140 02/28/2017 0304   K 4.2 02/28/2017 0304   CL 107 02/28/2017 0304   CO2 25 02/28/2017 0304   GLUCOSE 88 02/28/2017 0304   BUN 12 02/28/2017 0304   CREATININE 0.84 02/28/2017 0304   CALCIUM 9.1 02/28/2017 0304   GFRNONAA >60 02/28/2017 0304   GFRAA >60 02/28/2017 0304   COAG No results found for: INR, PROTIME   Disposition:  Discharge to :Home Discharge Instructions    Call MD for:  redness, tenderness, or signs of infection (pain, swelling, bleeding, redness, odor or  green/yellow discharge around incision site)    Complete by:  As directed    Call MD for:  severe or increased pain, loss or decreased feeling  in affected limb(s)    Complete by:  As directed    Call MD for:  temperature >100.5    Complete by:  As directed    Discharge instructions    Complete by:  As directed    You may remove dressings and shower in 24 hours.   Driving Restrictions    Complete by:  As directed    No driving for 1 week   Increase activity slowly     Complete by:  As directed    Walk with assistance use walker or cane as needed   Lifting restrictions    Complete by:  As directed    No lifting for 2-3 weeks   Resume previous diet    Complete by:  As directed      Allergies as of 02/28/2017      Reactions   Xylocaine [lidocaine Hcl]    "I went crazy" Lips turned blue and "almost died"      Medication List    TAKE these medications   aspirin EC 325 MG tablet Take 325 mg by mouth daily.   atorvastatin 20 MG tablet Commonly known as:  LIPITOR Take 20 mg by mouth daily.   clopidogrel 75 MG tablet Commonly known as:  PLAVIX Take 1 tablet (75 mg total) by mouth daily.   Fish Oil 1200 MG Caps Take 1,200 mg by mouth 2 (two) times daily.   gabapentin 600 MG tablet Commonly known as:  NEURONTIN Take 600 mg by mouth 3 (three) times daily.   Melatonin 3 MG Tabs Take 3 mg by mouth at bedtime.   meloxicam 15 MG tablet Commonly known as:  MOBIC Take 15 mg by mouth daily.   traMADol 50 MG tablet Commonly known as:  ULTRAM Take 50 mg by mouth 2 (two) times daily.            Discharge Care Instructions        Start     Ordered   02/28/17 0000  Resume previous diet     02/28/17 0804   02/28/17 0000  Driving Restrictions    Comments:  No driving for 1 week   14/78/2908/25/18 0804   02/28/17 0000  Lifting restrictions    Comments:  No lifting for 2-3 weeks   02/28/17 0804   02/28/17 0000  Call MD for:  temperature >100.5     02/28/17 0804   02/28/17 0000  Call MD for:  redness, tenderness, or signs of infection (pain, swelling, bleeding, redness, odor or green/yellow discharge around incision site)     02/28/17 0804   02/28/17 0000  Call MD for:  severe or increased pain, loss or decreased feeling  in affected limb(s)     02/28/17 0804   02/28/17 0000  Discharge instructions    Comments:  You may remove dressings and shower in 24 hours.   02/28/17 0804   02/28/17 0000  Increase activity slowly    Comments:  Walk with  assistance use walker or cane as needed   02/28/17 0804   02/27/17 0000  clopidogrel (PLAVIX) 75 MG tablet  Daily     02/27/17 1104     Verbal and written Discharge instructions given to the patient. Wound care per Discharge AVS Follow-up Information    Sherren KernsFields, Charles E, MD Follow up  in 2 week(s).   Specialties:  Vascular Surgery, Cardiology Why:  office will call with appt. Contact information: [address] [place] [address] [phone]           Signed: Clinton Gallant [facility] 03/04/2017, 10:04 AM

## 2017-03-13 ENCOUNTER — Ambulatory Visit (HOSPITAL_COMMUNITY)
Admission: RE | Admit: 2017-03-13 | Discharge: 2017-03-13 | Disposition: A | Discharge status: Home / Self Care | Payer: BLUE CROSS/BLUE SHIELD | Source: Ambulatory Visit | Attending: Vascular Surgery | Admitting: Vascular Surgery

## 2017-03-13 ENCOUNTER — Telehealth: Payer: Self-pay | Admitting: Vascular Surgery

## 2017-03-13 ENCOUNTER — Encounter (HOSPITAL_COMMUNITY)
Admission: RE | Disposition: A | Discharge status: Home / Self Care | Payer: Self-pay | Source: Ambulatory Visit | Attending: Vascular Surgery

## 2017-03-13 ENCOUNTER — Other Ambulatory Visit: Payer: Self-pay

## 2017-03-13 DIAGNOSIS — F17210 Nicotine dependence, cigarettes, uncomplicated: Secondary | ICD-10-CM | POA: Insufficient documentation

## 2017-03-13 DIAGNOSIS — M1990 Unspecified osteoarthritis, unspecified site: Secondary | ICD-10-CM | POA: Diagnosis not present

## 2017-03-13 DIAGNOSIS — E785 Hyperlipidemia, unspecified: Secondary | ICD-10-CM | POA: Insufficient documentation

## 2017-03-13 DIAGNOSIS — M797 Fibromyalgia: Secondary | ICD-10-CM | POA: Diagnosis not present

## 2017-03-13 DIAGNOSIS — J45909 Unspecified asthma, uncomplicated: Secondary | ICD-10-CM | POA: Insufficient documentation

## 2017-03-13 DIAGNOSIS — G629 Polyneuropathy, unspecified: Secondary | ICD-10-CM | POA: Diagnosis not present

## 2017-03-13 DIAGNOSIS — Z95820 Peripheral vascular angioplasty status with implants and grafts: Secondary | ICD-10-CM

## 2017-03-13 DIAGNOSIS — I70211 Atherosclerosis of native arteries of extremities with intermittent claudication, right leg: Secondary | ICD-10-CM | POA: Insufficient documentation

## 2017-03-13 DIAGNOSIS — I739 Peripheral vascular disease, unspecified: Secondary | ICD-10-CM | POA: Diagnosis not present

## 2017-03-13 HISTORY — PX: ABDOMINAL AORTOGRAM W/LOWER EXTREMITY: CATH118223

## 2017-03-13 HISTORY — PX: PERIPHERAL VASCULAR INTERVENTION: CATH118257

## 2017-03-13 LAB — POCT I-STAT, CHEM 8
BUN: 21 mg/dL — ABNORMAL HIGH (ref 6–20)
CHLORIDE: 102 mmol/L (ref 101–111)
Calcium, Ion: 1.2 mmol/L (ref 1.15–1.40)
Creatinine, Ser: 0.9 mg/dL (ref 0.61–1.24)
GLUCOSE: 105 mg/dL — AB (ref 65–99)
HCT: 35 % — ABNORMAL LOW (ref 39.0–52.0)
HEMOGLOBIN: 11.9 g/dL — AB (ref 13.0–17.0)
POTASSIUM: 4 mmol/L (ref 3.5–5.1)
SODIUM: 140 mmol/L (ref 135–145)
TCO2: 26 mmol/L (ref 22–32)

## 2017-03-13 LAB — POCT ACTIVATED CLOTTING TIME: Activated Clotting Time: 175 seconds

## 2017-03-13 SURGERY — ABDOMINAL AORTOGRAM W/LOWER EXTREMITY
Anesthesia: LOCAL | Laterality: Right

## 2017-03-13 MED ORDER — SODIUM CHLORIDE 0.9% FLUSH: 3.0000 mL | INTRAVENOUS | Status: DC | PRN

## 2017-03-13 MED ORDER — MORPHINE SULFATE (PF) 10 MG/ML IV SOLN
2.0000 mg | INTRAVENOUS | Status: DC | PRN
Start: 2017-03-13 — End: 2017-03-13

## 2017-03-13 MED ORDER — SODIUM CHLORIDE 0.9 % IV SOLN: INTRAVENOUS | Status: AC

## 2017-03-13 MED ORDER — HEPARIN (PORCINE) IN NACL 2-0.9 UNIT/ML-% IJ SOLN
INTRAMUSCULAR | Status: AC | PRN
  Administered 2017-03-13: 1000 mL via INTRA_ARTERIAL

## 2017-03-13 MED ORDER — LIDOCAINE HCL (PF) 1 % IJ SOLN
INTRAMUSCULAR | Status: AC
  Filled 2017-03-13: qty 30

## 2017-03-13 MED ORDER — SODIUM CHLORIDE 0.9 % IV SOLN
INTRAVENOUS | Status: DC
  Administered 2017-03-13: 06:00:00 via INTRAVENOUS

## 2017-03-13 MED ORDER — OXYCODONE HCL 5 MG PO TABS: 5.0000 mg | ORAL_TABLET | ORAL | Status: DC | PRN

## 2017-03-13 MED ORDER — LABETALOL HCL 5 MG/ML IV SOLN: 10.0000 mg | INTRAVENOUS | Status: DC | PRN

## 2017-03-13 MED ORDER — HEPARIN SODIUM (PORCINE) 1000 UNIT/ML IJ SOLN
INTRAMUSCULAR | Status: AC
  Filled 2017-03-13: qty 1

## 2017-03-13 MED ORDER — TETRACAINE HCL 1 % IJ SOLN
30.0000 mg | Freq: Once | INTRAMUSCULAR | Status: DC
  Filled 2017-03-13: qty 4

## 2017-03-13 MED ORDER — SODIUM TETRADECYL SULFATE 1 % IV SOLN
INTRAVENOUS | Status: DC | PRN
  Administered 2017-03-13: 10 mL via INTRADERMAL

## 2017-03-13 MED ORDER — HYDRALAZINE HCL 20 MG/ML IJ SOLN: 5.0000 mg | INTRAMUSCULAR | Status: DC | PRN

## 2017-03-13 MED ORDER — SODIUM CHLORIDE 0.9% FLUSH: 3.0000 mL | Freq: Two times a day (BID) | INTRAVENOUS | Status: DC

## 2017-03-13 MED ORDER — TETRACAINE HCL 1 % IJ SOLN
120.0000 mg | Freq: Once | INTRAMUSCULAR | Status: DC
  Filled 2017-03-13: qty 12

## 2017-03-13 MED ORDER — HEPARIN (PORCINE) IN NACL 2-0.9 UNIT/ML-% IJ SOLN
INTRAMUSCULAR | Status: AC
  Filled 2017-03-13: qty 1000

## 2017-03-13 MED ORDER — HEPARIN SODIUM (PORCINE) 1000 UNIT/ML IJ SOLN
INTRAMUSCULAR | Status: DC | PRN
  Administered 2017-03-13: 8000 [IU] via INTRAVENOUS

## 2017-03-13 MED ORDER — SODIUM CHLORIDE 0.9 % IV SOLN: 250.0000 mL | INTRAVENOUS | Status: DC | PRN

## 2017-03-13 SURGICAL SUPPLY — 29 items
BALLN LUTONIX DCB 4X40X130 (BALLOONS) ×3
BALLN MUSTANG 4.0X40 75 (BALLOONS) ×3
BALLN MUSTANG 6.0X40 75 (BALLOONS) ×3
BALLOON LUTONIX DCB 4X40X130 (BALLOONS) ×2 IMPLANT
BALLOON MUSTANG 4.0X40 75 (BALLOONS) ×2 IMPLANT
BALLOON MUSTANG 6.0X40 75 (BALLOONS) ×2 IMPLANT
CATH ANGIO 5F PIGTAIL 65CM (CATHETERS) IMPLANT
CATH CROSS OVER TEMPO 5F (CATHETERS) ×3 IMPLANT
CATH QUICKCROSS SUPP .035X90CM (MICROCATHETER) ×3 IMPLANT
COVER PRB 48X5XTLSCP FOLD TPE (BAG) ×2 IMPLANT
COVER PROBE 5X48 (BAG) ×1
DEVICE CONTINUOUS FLUSH (MISCELLANEOUS) ×3 IMPLANT
GLIDEWIRE ANGLED SS 035X260CM (WIRE) ×3 IMPLANT
GUIDEWIRE ANGLED .035X150CM (WIRE) ×3 IMPLANT
KIT ENCORE 26 ADVANTAGE (KITS) ×3 IMPLANT
KIT PV (KITS) ×3 IMPLANT
SHEATH PINNACLE 5F 10CM (SHEATH) ×3 IMPLANT
SHEATH PINNACLE 8F 10CM (SHEATH) ×3 IMPLANT
SHEATH PINNACLE MP 7F 45CM (SHEATH) ×3 IMPLANT
STENT ABSOLUTE PRO 6X40X135 (Permanent Stent) ×3 IMPLANT
STENT INNOVA 6X40X130 (Permanent Stent) ×3 IMPLANT
STOPCOCK MORSE 400PSI 3WAY (MISCELLANEOUS) ×3 IMPLANT
SYRINGE MEDRAD AVANTA MACH 7 (SYRINGE) ×3 IMPLANT
TRANSDUCER W/STOPCOCK (MISCELLANEOUS) ×3 IMPLANT
TRAY PV CATH (CUSTOM PROCEDURE TRAY) ×3 IMPLANT
TUBING CIL FLEX 10 FLL-RA (TUBING) ×3 IMPLANT
WIRE HITORQ VERSACORE ST 145CM (WIRE) ×3 IMPLANT
WIRE ROSEN-J .035X180CM (WIRE) ×3 IMPLANT
WIRE ROSEN-J .035X260CM (WIRE) ×3 IMPLANT

## 2017-03-13 NOTE — Progress Notes (Addendum)
Site area: LFA Site Prior to Removal:  Level 1 Pressure Applied For: 28 min Manual:  yes  Patient Status During Pull: stable  Post Pull Site:  Level Post Pull Instructions Given: yes  Post Pull Pulses Present: palpable DP Dressing Applied:  tegaderm Bedrest begins @ 1030 till 1530 Comments: area remains soft and slightly raised With faint bruise.

## 2017-03-13 NOTE — Telephone Encounter (Signed)
Sched appt 04/16/17; lab at 3:30, MD at 4:00. Lm on hm#.

## 2017-03-13 NOTE — Interval H&P Note (Signed)
History and Physical Interval Note:  03/13/2017 7:35 AM  Tommy Larson  has presented today for surgery, with the diagnosis of pvd  The various methods of treatment have been discussed with the patient and family. After consideration of risks, benefits and other options for treatment, the patient has consented to  Procedure(s): ABDOMINAL AORTOGRAM W/LOWER EXTREMITY (Right) as a surgical intervention .  The patient's history has been reviewed, patient examined, no change in status, stable for surgery.  I have reviewed the patient's chart and labs.  Questions were answered to the patient's satisfaction.     Fabienne BrunsFields, Angla Delahunt

## 2017-03-13 NOTE — H&P (View-Only) (Signed)
Referring Physician: Eunice BlaseGreta O'Buch PA  [name] MRN: [id number] DOB: [birth year] Sex: male  REASON FOR CONSULT: Bilateral leg pain  HPI: Tommy Larson is a 51 y.o. male with a 3 year history of bilateral lower extremity cramping type pain in the calf. Patient states his symptoms are brought on by walking. After he walks about 100 feet he develops this pain. It is relieved after about 5 minutes of rest. He currently smokes 1 pack of cigarettes per day. Greater than 3 minutes today were spent regarding smoking cessation counseling. He states that he is discussing with his primary care physician the possibility of starting Chantix but has not decided to start this yet. He currently is not on aspirin. He denies rest pain in his feet. He has no history of nonhealing wounds. He is on Lipitor.  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  Past Medical History:  Diagnosis Date  . Arthritis   . Asthma   . Claudication (HCC) 12/09/2016   Bilateral LE  . Dyslipidemia 09/06/2015  . Fibromyalgia   . Neuromuscular disorder (HCC)    neuropathy   No past surgical history on file.  Family History  Problem Relation Age of Onset  . Asthma Mother   . Arthritis Mother   . Arthritis Sister     SOCIAL HISTORY: Social History   Social History  . Marital status: Unknown    Spouse name: N/A  . Number of children: N/A  . Years of education: N/A   Occupational History  . Not on file.   Social History Main Topics  . Smoking status: Current Every Day Smoker    Packs/day: 1.00  . Smokeless tobacco: Never Used  . Alcohol use No  . Drug use: No  . Sexual activity: Not on file   Other Topics Concern  . Not on file   Social History Narrative  . No narrative on file    No Known Allergies  Current Outpatient Prescriptions  Medication Sig Dispense Refill  . atorvastatin (LIPITOR) 20 MG tablet Take 20 mg by mouth  daily.    Marland Kitchen. gabapentin (NEURONTIN) 600 MG tablet Take 600 mg by mouth 3 (three) times daily.    . meloxicam (MOBIC) 15 MG tablet Take 15 mg by mouth daily.    . traMADol (ULTRAM) 50 MG tablet Take by mouth every 6 (six) hours as needed.     No current facility-administered medications for this visit.     ROS:   General:  No weight loss, Fever, chills  HEENT: No recent headaches, no nasal bleeding, no visual changes, no sore throat  Neurologic: No dizziness, blackouts, seizures. No recent symptoms of stroke or mini- stroke. No recent episodes of slurred speech, or temporary blindness.  Cardiac: No recent episodes of chest pain/pressure, no shortness of breath at rest.  No shortness of breath with exertion.  Denies history of atrial fibrillation or irregular heartbeat  Vascular: No history of rest pain in feet.  + history of claudication.  No history of non-healing ulcer, No history of DVT   Pulmonary: No home oxygen, no productive cough, no hemoptysis,  No asthma or wheezing  Musculoskeletal:  [ ]  Arthritis, [ ]  Low back pain,  [ ]  Joint pain  Hematologic:No history of hypercoagulable state.  No history of easy bleeding.  No history of anemia  Gastrointestinal: No hematochezia or melena,  No gastroesophageal reflux,  no trouble swallowing  Urinary: [ ] chronic Kidney disease, [ ] on HD - [ ] MWF or [ ] TTHS, [ ] Burning with urination, [ ] Frequent urination, [ ] Difficulty urinating;   Skin: No rashes  Psychological: No history of anxiety,  No history of depression   Physical Examination  Vitals:   02/19/17 0943  BP: 112/71  Pulse: (!) 53  Resp: 16  Temp: (!) 97.5 F (36.4 C)  TempSrc: Oral  SpO2: 97%  Weight: 159 lb (72.1 kg)  Height: 5' 11" (1.803 m)    Body mass index is 22.18 kg/m.  General:  Alert and oriented, no acute distress HEENT: Normal Neck: No bruit or JVD Pulmonary: Clear to auscultation bilaterally Cardiac: Regular Rate and Rhythm without  murmur Abdomen: Soft, non-tender, non-distended, no mass Skin: No rash Extremity Pulses:  2+ radial, brachial,1+ femoral, absent popliteal dorsalis pedis, posterior tibial pulses bilaterally Musculoskeletal: No deformity or edema  Neurologic: Upper and lower extremity motor 5/5 and symmetric  DATA:  Patient had bilateral ABIs performed at [facility]. We have sent out to get those results but they were not available for review today. The patient had lower extremity duplex exam performed at our office today. This showed monophasic waveforms diffusely in both legs. He had a right external iliac artery stenosis. He had mild stenosis of the left iliac system as well. He also had evidence of a mid right superficial femoral artery stenosis.  ASSESSMENT:  Bilateral lower extremity claudication. Discussed the patient today walking program of 30 minutes daily. Also discussed with him today smoking cessation. I emphasized to him that if he is able to quit smoking his lifetime risk of amputation is less than 5%. However I did emphasize to him that if he continues to smoke he would be at risk of limb loss long-term. I also discussed with him the possibility of aortogram lower extremity runoff possible intervention to improve his symptoms. I did discuss with him that this would be for lifestyle related problems and that he currently is not at risk of limb loss. Risks benefits possible complications and procedure details including but not limited to bleeding infection vessel injury contrast reaction were explained the patient today. He understands and wishes to proceed with arteriography because he feels like he is unable to perform his job as well by not being able to walk certain distances.   PLAN:  #1 Will add aspirin daily to patient's medication regimen. Emphasized to him that this is to prevent myocardial infarction or stroke with the presence of peripheral arterial disease.  Again emphasized the  patient to quit smoking. I offered to write a prescription for Chantix today but he wishes to think about this more for now.  Abdominal aortogram lower extremity runoff possible intervention scheduled for 02/27/2017. Patient understands that if his occlusive disease is extensive and not amenable to angioplasty or stenting he may need to consider operative repair which will be scheduled at an additional date   Earnest Mcgillis, MD [facility] [place] Office: [phone] Pager: [phone]  Addendum: 02/27/17  0.8 bilateral suggestive of iliac inflow disease from [facility]  

## 2017-03-13 NOTE — Telephone Encounter (Signed)
-----   Message from Phillips Odorarol S Pullins, RN sent at 03/13/2017 10:09 AM EDT ----- Regarding: needs f/u with Dr. Darrick PennaFields in 2-3 weeks with ABI's   ----- Message ----- From: Sherren KernsFields, Charles E, MD Sent: 03/13/2017   9:28 AM To: [facility] Charge Pool  Procedure: Right lower extremity arteriogram, right superficial femoral artery angioplasty (4x 40 followed by 4x 40 drug-eluting) followed by 6 x 40 self-expanding stent, right external iliac artery stent (6 x 40 self-expanding)  He needs appt in 2-3 weeks with repeat bilateral ABIs  Fabienne Brunsharles Fields, MD [facility] [place] Office: [phone] Pager: [phone]

## 2017-03-13 NOTE — Discharge Instructions (Signed)

## 2017-03-13 NOTE — Op Note (Signed)
Procedure: Right lower extremity arteriogram, right superficial femoral artery angioplasty (4x 40 followed by 4x 40 drug-eluting) followed by 6 x 40 self-expanding stent, right external iliac artery stent (6 x 40 self-expanding)  Preoperative diagnosis: Claudication  Postoperative diagnosis: Same  Anesthesia: Local  Operative findings: #1 short subtotal occlusion right superficial femoral artery stented to 0% residual stenosis #2 80% right distal external iliac artery stenosis stented to 0% residual stenosis  #3 three-vessel runoff right foot  Operative details: After obtaining informed consent, the patient was brought to the [facility] lab. The patient was placed in supine position the Angio table. Both groins were prepped and draped in usual sterile fashion. Local anesthesia was infiltrated over the left common femoral artery. This was in the form of tetracaine due to patient's Xylocaine allergy. Ultrasound was used to localize left common femoral artery and an introducer needle was used to cannulate the left common femoral artery without difficulty. An 035 versacore wire was then threaded up in the abdominal aorta under fluoroscopic guidance. A 5 French sheath was then placed over the guidewire and the left common femoral artery. A 5 French crossover catheter was then brought in operative field and advanced over the versacore wire and used to selectively catheterize the right common iliac artery. An 035 angled Glidewire was then advanced to the crossover catheter down into the right common femoral artery. The Glidewire was exchanged for a long Rosen wire. The 5 French sheath was then removed and exchanged for a 7 [place] destination sheath. This was advanced up and over the aortic bifurcation. The patient was then given 8000 units of intravenous heparin. ACT was confirmed to be greater than 290. Quick Cross catheter was advanced over this and the Glidewire exchanged for an [employer] wire. The Rosen wire was then  advanced down into the distal right superficial femoral artery at the level of the subtotal occlusion. The quick cross catheter was then placed over the [facility] wire and exchanged for an 035 angled Glidewire. I was then able to advance this across the lesion and this was confirmed to be intraluminal with contrast angiogram.  Catheter was advanced over the Glidewire into the distal popliteal artery. The Glidewire was removed and exchanged back for the [facility] wire. The quick cross catheter was then removed. This was a fairly focal calcified lesion in the distal SFA. I thought it may be amenable to drug-coated balloon angioplasty. Therefore initially a 4 x 40 mm angioplasty balloon was brought up in operative field prepped the vessel.  This was centered on the lesion and inflated to nominal pressure for 1 minute. This balloon was then removed and exchanged for a 4 x 40 Lutonix balloon was then centered on the lesion and inflated for 3 minutes to nominal pressure. Completion angiogram was performed and the area of calcification wasn't exophytic type lesion and still seemed to be extruding into the lumen. There is at least a residual 50% stenosis. I felt the way to get rid of this calcified lesion would be to stent the area. Therefore a 6 x 40 mm self-expanding stent was brought up in operative field and centered on the lesion and deployed. It was then postdilated to 6 mm with a 6x40 balloon for 1 minute.  Completion angiogram showed wide patency of the stent was 0 residual stenosis. The sheath was then pulled back into the right hemipelvis. Contrast angiogram was performed of the right external iliac artery. There was an 80% stenosis of the distal portion of  the right external iliac artery. An additional 6 x 40 self-expanding stent was brought in operative field and centered on the lesion and deployed. It was then postdilated with a 640 balloon to 10 atm for 1 minute. Completion angiogram showed wide patency of the stent  0 residual stenosis no evidence of dissection. I then proceeded to do a formal right lower extremity arteriogram which showed the right common femoral profunda femoris and superficial femoral arteries were all widely patent. The right popliteal artery was widely patent. There is three-vessel runoff to the right foot. However, there was poor opacification distally due to contrast dilution.  The 7 French sheath was then pulled back down in the left hemipelvis over the guidewire. The guidewire was removed. There was some oozing around the sheath. Therefore the guidewire was reinserted and 7 [place] sheath exchanged for a short 8 sheath. There was good hemostasis throughout the sheath at this point. This was thoroughly flushed with heparinized saline. The patient tried procedure well and there were no complications. The patient was taken to the holding area in stable condition. Sheath will be removed when ACT is less than 75.  Ruta Hinds, MD [facility] Office: [phone] Pager: [phone]

## 2017-03-16 ENCOUNTER — Encounter (HOSPITAL_COMMUNITY): Payer: Self-pay | Admitting: Vascular Surgery

## 2017-03-16 LAB — POCT ACTIVATED CLOTTING TIME
Activated Clotting Time: 296 seconds
Activated Clotting Time: 219 seconds

## 2017-03-25 DIAGNOSIS — Z79899 Other long term (current) drug therapy: Secondary | ICD-10-CM | POA: Diagnosis not present

## 2017-03-25 DIAGNOSIS — M797 Fibromyalgia: Secondary | ICD-10-CM | POA: Diagnosis not present

## 2017-03-25 DIAGNOSIS — G4700 Insomnia, unspecified: Secondary | ICD-10-CM | POA: Diagnosis not present

## 2017-03-25 DIAGNOSIS — I7389 Other specified peripheral vascular diseases: Secondary | ICD-10-CM | POA: Diagnosis not present

## 2017-03-25 DIAGNOSIS — E785 Hyperlipidemia, unspecified: Secondary | ICD-10-CM | POA: Diagnosis not present

## 2017-03-25 DIAGNOSIS — Z1389 Encounter for screening for other disorder: Secondary | ICD-10-CM | POA: Diagnosis not present

## 2017-04-16 ENCOUNTER — Encounter (HOSPITAL_COMMUNITY): Payer: BLUE CROSS/BLUE SHIELD

## 2017-04-16 ENCOUNTER — Ambulatory Visit: Payer: BLUE CROSS/BLUE SHIELD | Admitting: Vascular Surgery

## 2017-04-24 DIAGNOSIS — G4700 Insomnia, unspecified: Secondary | ICD-10-CM | POA: Diagnosis not present

## 2017-04-24 DIAGNOSIS — I739 Peripheral vascular disease, unspecified: Secondary | ICD-10-CM | POA: Diagnosis not present

## 2017-04-24 DIAGNOSIS — M797 Fibromyalgia: Secondary | ICD-10-CM | POA: Diagnosis not present

## 2017-04-24 DIAGNOSIS — Z23 Encounter for immunization: Secondary | ICD-10-CM | POA: Diagnosis not present

## 2017-04-24 DIAGNOSIS — E785 Hyperlipidemia, unspecified: Secondary | ICD-10-CM | POA: Diagnosis not present

## 2017-07-14 DIAGNOSIS — E785 Hyperlipidemia, unspecified: Secondary | ICD-10-CM | POA: Diagnosis not present

## 2017-07-14 DIAGNOSIS — M797 Fibromyalgia: Secondary | ICD-10-CM | POA: Diagnosis not present

## 2017-07-14 DIAGNOSIS — M130 Polyarthritis, unspecified: Secondary | ICD-10-CM | POA: Diagnosis not present

## 2017-07-14 DIAGNOSIS — G4700 Insomnia, unspecified: Secondary | ICD-10-CM | POA: Diagnosis not present

## 2017-07-14 DIAGNOSIS — Z79899 Other long term (current) drug therapy: Secondary | ICD-10-CM | POA: Diagnosis not present

## 2017-10-12 DIAGNOSIS — Z79899 Other long term (current) drug therapy: Secondary | ICD-10-CM | POA: Diagnosis not present

## 2017-10-12 DIAGNOSIS — E785 Hyperlipidemia, unspecified: Secondary | ICD-10-CM | POA: Diagnosis not present

## 2017-10-12 DIAGNOSIS — M130 Polyarthritis, unspecified: Secondary | ICD-10-CM | POA: Diagnosis not present

## 2017-10-12 DIAGNOSIS — G4700 Insomnia, unspecified: Secondary | ICD-10-CM | POA: Diagnosis not present

## 2017-10-12 DIAGNOSIS — G629 Polyneuropathy, unspecified: Secondary | ICD-10-CM | POA: Diagnosis not present

## 2018-01-15 DIAGNOSIS — Z6823 Body mass index (BMI) 23.0-23.9, adult: Secondary | ICD-10-CM | POA: Diagnosis not present

## 2018-01-15 DIAGNOSIS — E785 Hyperlipidemia, unspecified: Secondary | ICD-10-CM | POA: Diagnosis not present

## 2018-01-15 DIAGNOSIS — I739 Peripheral vascular disease, unspecified: Secondary | ICD-10-CM | POA: Diagnosis not present

## 2018-01-15 DIAGNOSIS — G4700 Insomnia, unspecified: Secondary | ICD-10-CM | POA: Diagnosis not present

## 2018-02-23 ENCOUNTER — Other Ambulatory Visit: Payer: Self-pay | Admitting: Vascular Surgery

## 2018-03-11 ENCOUNTER — Telehealth: Payer: Self-pay | Admitting: Vascular Surgery

## 2018-03-11 NOTE — Telephone Encounter (Signed)
sch appt lvm 04/08/18 10am ABI 1045pm F/u MD

## 2018-03-12 ENCOUNTER — Other Ambulatory Visit: Payer: Self-pay

## 2018-03-12 DIAGNOSIS — I739 Peripheral vascular disease, unspecified: Secondary | ICD-10-CM

## 2018-03-12 DIAGNOSIS — R6889 Other general symptoms and signs: Secondary | ICD-10-CM

## 2018-04-08 ENCOUNTER — Ambulatory Visit (HOSPITAL_COMMUNITY)
Admission: RE | Admit: 2018-04-08 | Discharge: 2018-04-08 | Disposition: A | Discharge status: Home / Self Care | Payer: BLUE CROSS/BLUE SHIELD | Source: Ambulatory Visit | Attending: Vascular Surgery | Admitting: Vascular Surgery

## 2018-04-08 ENCOUNTER — Ambulatory Visit (INDEPENDENT_AMBULATORY_CARE_PROVIDER_SITE_OTHER): Payer: BLUE CROSS/BLUE SHIELD | Admitting: Vascular Surgery

## 2018-04-08 ENCOUNTER — Encounter: Payer: Self-pay | Admitting: Vascular Surgery

## 2018-04-08 ENCOUNTER — Other Ambulatory Visit: Payer: Self-pay

## 2018-04-08 VITALS — BP 110/69 | HR 51 | Resp 18 | Ht 71.0 in | Wt 161.8 lb

## 2018-04-08 DIAGNOSIS — I739 Peripheral vascular disease, unspecified: Secondary | ICD-10-CM | POA: Diagnosis not present

## 2018-04-08 DIAGNOSIS — R6889 Other general symptoms and signs: Secondary | ICD-10-CM

## 2018-04-08 DIAGNOSIS — R0989 Other specified symptoms and signs involving the circulatory and respiratory systems: Secondary | ICD-10-CM | POA: Insufficient documentation

## 2018-04-08 DIAGNOSIS — F17200 Nicotine dependence, unspecified, uncomplicated: Secondary | ICD-10-CM | POA: Insufficient documentation

## 2018-04-08 NOTE — Progress Notes (Signed)
Patient is a 52 year old male who returns for follow-up today.  He previously had a right external iliac and right superficial femoral artery stent placed in September 2018 for claudication symptoms.  He states his right leg was working well for about 2 months and then he began to have recurrent claudication symptoms.  He currently experience his problems in his right calf at about 1 block of walking.  He denies rest pain or nonhealing wounds.  Unfortunately he continues to smoke cigarettes about a pack per week.  Greater than 3 minutes today spent regarding smoking cessation counseling.  Other chronic medical problems include chronic back pain hyperlipidemia both of which are currently stable.  On Plavix aspirin and statin.  Current Outpatient Medications on File Prior to Visit  Medication Sig Dispense Refill  . aspirin EC 325 MG tablet Take 325 mg by mouth daily.    Marland Kitchen atorvastatin (LIPITOR) 20 MG tablet Take 20 mg by mouth daily.    . clopidogrel (PLAVIX) 75 MG tablet TAKE 1 TABLET ONCE DAILY 30 tablet 11  . gabapentin (NEURONTIN) 600 MG tablet Take 600 mg by mouth 3 (three) times daily.    . Melatonin 3 MG TABS Take 3 mg by mouth at bedtime.    . meloxicam (MOBIC) 15 MG tablet Take 15 mg by mouth daily.    . Omega-3 Fatty Acids (FISH OIL) 1200 MG CAPS Take 1,200 mg by mouth 2 (two) times daily.    . traMADol (ULTRAM) 50 MG tablet Take 50 mg by mouth 2 (two) times daily.      No current facility-administered medications on file prior to visit.      Past Medical History:  Diagnosis Date  . Arthritis    "back" (02/27/2017)  . Asthma   . Chronic back pain    "all over" (02/27/2017)  . Claudication (HCC) 12/09/2016   Bilateral LE  . Dyslipidemia 09/06/2015  . Fibromyalgia   . Neuromuscular disorder (HCC)    neuropathy  . PAD (peripheral artery disease) (HCC)     Past Surgical History:  Procedure Laterality Date  . ABDOMINAL AORTOGRAM W/LOWER EXTREMITY  02/27/2017  . ABDOMINAL  AORTOGRAM W/LOWER EXTREMITY N/A 02/27/2017   Procedure: ABDOMINAL AORTOGRAM W/LOWER EXTREMITY;  Surgeon: Sherren Kerns, MD;  Location: [facility] INVASIVE CV LAB;  Service: Cardiovascular;  Laterality: N/A;  . ABDOMINAL AORTOGRAM W/LOWER EXTREMITY Right 03/13/2017   Procedure: ABDOMINAL AORTOGRAM W/LOWER EXTREMITY;  Surgeon: Sherren Kerns, MD;  Location: [facility] INVASIVE CV LAB;  Service: Cardiovascular;  Laterality: Right;  . ILIAC ARTERY STENT Left 02/27/2017   external iliac stent (self-expanding 6 x 40 mm)  . PERIPHERAL VASCULAR INTERVENTION Left 02/27/2017   Procedure: PERIPHERAL VASCULAR INTERVENTION;  Surgeon: Sherren Kerns, MD;  Location: [facility] INVASIVE CV LAB;  Service: Cardiovascular;  Laterality: Left;  left external iliac  . PERIPHERAL VASCULAR INTERVENTION  03/13/2017   Procedure: PERIPHERAL VASCULAR INTERVENTION;  Surgeon: Sherren Kerns, MD;  Location: [facility] INVASIVE CV LAB;  Service: Cardiovascular;;  RIGHT LOWER EXT  . TONSILLECTOMY     Social History   Socioeconomic History  . Marital status: Married    Spouse name: Not on file  . Number of children: Not on file  . Years of education: Not on file  . Highest education level: Not on file  Occupational History  . Not on file  Social Needs  . Financial resource strain: Not on file  . Food insecurity:    Worry: Not on file  Inability: Not on file  . Transportation needs:    Medical: Not on file    Non-medical: Not on file  Tobacco Use  . Smoking status: Current Every Day Smoker    Packs/day: 1.50    Years: 39.00    Pack years: 58.50    Types: Cigarettes  . Smokeless tobacco: Never Used  Substance and Sexual Activity  . Alcohol use: No  . Drug use: No  . Sexual activity: Yes  Lifestyle  . Physical activity:    Days per week: Not on file    Minutes per session: Not on file  . Stress: Not on file  Relationships  . Social connections:    Talks on phone: Not on file    Gets together: Not on file    Attends religious  service: Not on file    Active member of club or organization: Not on file    Attends meetings of clubs or organizations: Not on file    Relationship status: Not on file  . Intimate partner violence:    Fear of current or ex partner: Not on file    Emotionally abused: Not on file    Physically abused: Not on file    Forced sexual activity: Not on file  Other Topics Concern  . Not on file  Social History Narrative  . Not on file    Review of systems: He denies chest pain.  He denies shortness of breath.  Physical exam:  Vitals:   04/08/18 1016  BP: 110/69  Pulse: (!) 51  Resp: 18  SpO2: 99%  Weight: 161 lb 12.8 oz (73.4 kg)  Height: 5\' 11"  (1.803 m)    Neck: No carotid bruits  Chest: Clear to auscultation bilaterally  Cardiac: Regular rate and rhythm  Abdomen: Soft nontender nondistended  Extremities: 2+ femoral pulses bilaterally 2+ dorsalis pedis pulse left foot absent popliteal and pedal pulses right leg  Skin: Right foot pink warm no ulcers  Data: Patient had bilateral ABIs performed today which I reviewed and interpreted.  Right side was 0.6 left side was 0.99  Assessment: Peripheral arterial disease with recurrent claudication symptoms right leg.  Based on his physical exam and his ABIs most likely he has occluded the superficial femoral artery stent.  His iliac stent is still patent.  I discussed with the patient the possibility of revisiting the arteriogram possible bypass of his right leg.  The patient states that for financial reasons he does not wish to proceed at this time.  He is fairly limited by his claudication symptoms but currently not at risk of limb loss.  I discussed with him today again smoking cessation.  Plan: The patient will follow-up in 2 months time with our nurse practitioner for repeat ABIs.  He will return sooner if he develops rest pain or nonhealing ulcers or more debilitating claudication symptoms.  Fabienne Bruns, MD [facility] Office: [phone] Pager: [phone] \

## 2018-04-16 DIAGNOSIS — Z23 Encounter for immunization: Secondary | ICD-10-CM | POA: Diagnosis not present

## 2018-04-16 DIAGNOSIS — E785 Hyperlipidemia, unspecified: Secondary | ICD-10-CM | POA: Diagnosis not present

## 2018-04-16 DIAGNOSIS — M797 Fibromyalgia: Secondary | ICD-10-CM | POA: Diagnosis not present

## 2018-04-16 DIAGNOSIS — G4700 Insomnia, unspecified: Secondary | ICD-10-CM | POA: Diagnosis not present

## 2018-04-16 DIAGNOSIS — Z79899 Other long term (current) drug therapy: Secondary | ICD-10-CM | POA: Diagnosis not present

## 2018-04-16 DIAGNOSIS — I739 Peripheral vascular disease, unspecified: Secondary | ICD-10-CM | POA: Diagnosis not present

## 2018-07-22 DIAGNOSIS — M130 Polyarthritis, unspecified: Secondary | ICD-10-CM | POA: Diagnosis not present

## 2018-07-22 DIAGNOSIS — G4700 Insomnia, unspecified: Secondary | ICD-10-CM | POA: Diagnosis not present

## 2018-07-22 DIAGNOSIS — Z1331 Encounter for screening for depression: Secondary | ICD-10-CM | POA: Diagnosis not present

## 2018-07-22 DIAGNOSIS — I739 Peripheral vascular disease, unspecified: Secondary | ICD-10-CM | POA: Diagnosis not present

## 2018-07-22 DIAGNOSIS — E785 Hyperlipidemia, unspecified: Secondary | ICD-10-CM | POA: Diagnosis not present

## 2018-10-08 ENCOUNTER — Ambulatory Visit: Payer: BLUE CROSS/BLUE SHIELD | Admitting: Family

## 2018-10-08 ENCOUNTER — Encounter (HOSPITAL_COMMUNITY): Payer: BLUE CROSS/BLUE SHIELD

## 2018-10-26 ENCOUNTER — Encounter (INDEPENDENT_AMBULATORY_CARE_PROVIDER_SITE_OTHER): Payer: Self-pay

## 2018-10-28 DIAGNOSIS — G4700 Insomnia, unspecified: Secondary | ICD-10-CM | POA: Diagnosis not present

## 2018-10-28 DIAGNOSIS — G629 Polyneuropathy, unspecified: Secondary | ICD-10-CM | POA: Diagnosis not present

## 2018-10-28 DIAGNOSIS — M130 Polyarthritis, unspecified: Secondary | ICD-10-CM | POA: Diagnosis not present

## 2018-10-28 DIAGNOSIS — I739 Peripheral vascular disease, unspecified: Secondary | ICD-10-CM | POA: Diagnosis not present

## 2018-12-01 DIAGNOSIS — Y9389 Activity, other specified: Secondary | ICD-10-CM | POA: Diagnosis not present

## 2018-12-01 DIAGNOSIS — S0501XA Injury of conjunctiva and corneal abrasion without foreign body, right eye, initial encounter: Secondary | ICD-10-CM | POA: Diagnosis not present

## 2018-12-01 DIAGNOSIS — X58XXXA Exposure to other specified factors, initial encounter: Secondary | ICD-10-CM | POA: Diagnosis not present

## 2018-12-01 DIAGNOSIS — T1591XA Foreign body on external eye, part unspecified, right eye, initial encounter: Secondary | ICD-10-CM | POA: Diagnosis not present

## 2018-12-20 ENCOUNTER — Other Ambulatory Visit: Payer: Self-pay

## 2018-12-20 DIAGNOSIS — I739 Peripheral vascular disease, unspecified: Secondary | ICD-10-CM

## 2018-12-23 ENCOUNTER — Encounter (HOSPITAL_COMMUNITY): Payer: BLUE CROSS/BLUE SHIELD

## 2018-12-23 ENCOUNTER — Ambulatory Visit: Payer: BLUE CROSS/BLUE SHIELD | Admitting: Family

## 2019-01-05 ENCOUNTER — Telehealth: Payer: Self-pay | Admitting: *Deleted

## 2019-01-05 NOTE — Telephone Encounter (Signed)
Virtual Visit Pre-Appointment Phone Call  Today, I spoke with [facility] and performed the following actions:  1. I explained that we are currently trying to limit exposure to the COVID-19 virus by seeing patients at home rather than in the office.  I explained that the visits are best done by video, but can be done by telephone.  I asked the patient if a virtual visit that the patient would like to try instead of coming into the office. Tommy Larson agreed to proceed with the virtual visit scheduled with Tommy Larson on 01/10/19.     2. I confirmed the [facility]ST phone number to call the day of the visit and- I included this in appointment notes.  3. I asked if the patient had access to (through a family member/friend) a smartphone with video capability to be used for his visit?"  The patient said yes -    4. I confirmed consent by  a. sending through [place] or by email the [facility] as written at the end of this message or  b. verbally as listed below. i. This visit is being performed in the setting of COVID-19. ii. All virtual visits are billed to your insurance company just like a normal visit would be.   iii. We'd like you to understand that the technology does not allow for your provider to perform an examination, and thus may limit your provider's ability to fully assess your condition.  iv. If your provider identifies any concerns that need to be evaluated in person, we will make arrangements to do so.   v. Finally, though the technology is pretty good, we cannot assure that it will always work on either your or our end, and in the setting of a video visit, we may have to convert it to a phone-only visit.  In either situation, we cannot ensure that we have a secure connection.   vi. Are you willing to proceed?"  STAFF: Did the patient verbally acknowledge consent to telehealth visit? Document YES/NO here: YES  2. I advised the patient to be prepared - I  asked that the patient, on the day of his visit, record any information possible with the equipment at his home, such as blood pressure, pulse, oxygen saturation, and your weight and write them all down. I asked the patient to have a pen and paper handy nearby the day of the visit as well.  3. If the patient was scheduled for a video visit, I informed the patient that the visit with the doctor would start with a text to the smartphone # given to [place] by the patient.         If the patient was scheduled for a telephone call, I informed the patient that the visit with the doctor would start with a call to the telephone # given to [place] by the patient.  4. I Informed patient they will receive a phone call 15 minutes prior to their appointment time from a [facility] or nurse to review medications, allergies, etc. to prepare for the visit.    TELEPHONE CALL NOTE  Tommy Larson has been deemed a candidate for a follow-up tele-health visit to limit community exposure during the Covid-19 pandemic. I spoke with the patient via phone to ensure availability of phone/video source, confirm preferred email & phone number, and discuss instructions and expectations.  I reminded Tommy Larson to be prepared with any vital sign and/or heart rhythm information that  could potentially be obtained via home monitoring, at the time of his visit. I reminded Tommy Larson to expect a phoChanning Muttersne call prior to his visit.  Rudi CocoLathan, Demetrias Goodbar M, NT 01/05/2019 11:54 AM     FULL LENGTH CONSENT FOR TELE-HEALTH VISIT   I hereby voluntarily request, consent and authorize [facility] and its employed or contracted physicians, physician assistants, nurse practitioners or other licensed health care professionals (the Practitioner), to provide me with telemedicine health care services (the "Services") as deemed necessary by the treating Practitioner. I acknowledge and consent to receive the Services by the Practitioner via telemedicine. I understand that the  telemedicine visit will involve communicating with the Practitioner through live audiovisual communication technology and the disclosure of certain medical information by electronic transmission. I acknowledge that I have been given the opportunity to request an in-person assessment or other available alternative prior to the telemedicine visit and am voluntarily participating in the telemedicine visit.  I understand that I have the right to withhold or withdraw my consent to the use of telemedicine in the course of my care at any time, without affecting my right to future care or treatment, and that the Practitioner or I may terminate the telemedicine visit at any time. I understand that I have the right to inspect all information obtained and/or recorded in the course of the telemedicine visit and may receive copies of available information for a reasonable fee.  I understand that some of the potential risks of receiving the Services via telemedicine include:  Marland Kitchen. Delay or interruption in medical evaluation due to technological equipment failure or disruption; . Information transmitted may not be sufficient (e.g. poor resolution of images) to allow for appropriate medical decision making by the Practitioner; and/or  . In rare instances, security protocols could fail, causing a breach of personal health information.  Furthermore, I acknowledge that it is my responsibility to provide information about my medical history, conditions and care that is complete and accurate to the best of my ability. I acknowledge that Practitioner's advice, recommendations, and/or decision may be based on factors not within their control, such as incomplete or inaccurate data provided by me or distortions of diagnostic images or specimens that may result from electronic transmissions. I understand that the practice of medicine is not an exact science and that Practitioner makes no warranties or guarantees regarding treatment  outcomes. I acknowledge that I will receive a copy of this consent concurrently upon execution via email to the email address I last provided but may also request a printed copy by calling the office of [facility].    I understand that my insurance will be billed for this visit.   I have read or had this consent read to me. . I understand the contents of this consent, which adequately explains the benefits and risks of the Services being provided via telemedicine.  . I have been provided ample opportunity to ask questions regarding this consent and the Services and have had my questions answered to my satisfaction. . I give my informed consent for the services to be provided through the use of telemedicine in my medical care  By participating in this telemedicine visit I agree to the above.

## 2019-01-06 ENCOUNTER — Telehealth (HOSPITAL_COMMUNITY): Payer: Self-pay | Admitting: Rehabilitation

## 2019-01-06 ENCOUNTER — Other Ambulatory Visit: Payer: Self-pay

## 2019-01-06 ENCOUNTER — Ambulatory Visit (HOSPITAL_COMMUNITY)
Admission: RE | Admit: 2019-01-06 | Discharge: 2019-01-06 | Disposition: A | Discharge status: Home / Self Care | Payer: BC Managed Care – PPO | Source: Ambulatory Visit | Attending: Family | Admitting: Family

## 2019-01-06 DIAGNOSIS — I739 Peripheral vascular disease, unspecified: Secondary | ICD-10-CM | POA: Diagnosis not present

## 2019-01-10 ENCOUNTER — Encounter: Payer: Self-pay | Admitting: Family

## 2019-01-10 ENCOUNTER — Other Ambulatory Visit: Payer: Self-pay

## 2019-01-10 ENCOUNTER — Ambulatory Visit (INDEPENDENT_AMBULATORY_CARE_PROVIDER_SITE_OTHER): Payer: BC Managed Care – PPO | Admitting: Family

## 2019-01-10 VITALS — Ht 71.0 in | Wt 170.0 lb

## 2019-01-10 DIAGNOSIS — F17200 Nicotine dependence, unspecified, uncomplicated: Secondary | ICD-10-CM

## 2019-01-10 DIAGNOSIS — I70211 Atherosclerosis of native arteries of extremities with intermittent claudication, right leg: Secondary | ICD-10-CM | POA: Diagnosis not present

## 2019-01-10 DIAGNOSIS — Z95820 Peripheral vascular angioplasty status with implants and grafts: Secondary | ICD-10-CM

## 2019-01-10 NOTE — Progress Notes (Signed)
Virtual Visit via Telephone Note   I connected with Tommy Skeansoy Giuffre on 01/10/2019 using the [email or web address] virtual platform and verified that I was speaking with the correct person using two identifiers. Patient was located at his home and accompanied by his wife. I am located at the [facility] office.   The limitations of evaluation and management by telemedicine and the availability of in person appointments have been previously discussed with the patient and are documented in the patients chart. The patient expressed understanding and consented to proceed.   PCP: Eunice Blase'Buch, Greta, PA-C   Chief Complaint: Follow up peripheral artery occlusive disease  History of Present Illness: Tommy Larson is a 53 y.o. male who is s/p right external iliac and right superficial femoral artery stent placed in September 2018 by Dr. Darrick PennaFields for claudication symptoms.    He states his right leg was working well for about 2 months and then he began to have recurrent claudication symptoms.    He currently experiences problems in his right calf at about 100-200 feet of walking.  He denies rest pain or nonhealing wounds.  Unfortunately he continues to smoke cigarettes about a half pack per day.   Other chronic medical problems include chronic back pain and hyperlipidemia both of which are currently stable.    Dr. Darrick PennaFields last evaluated pt on 04-08-18. At that time peripheral arterial disease with recurrent claudication symptoms right leg.  Based on his physical exam and his ABIs most likely he has occluded the superficial femoral artery stent.  His iliac stent was still patent. Dr. Darrick PennaFields discussed with the patient the possibility of revisiting the arteriogram possible bypass of his right leg. The patient stated that for financial reasons he did not wish to proceed at that time.  He was fairly limited by his claudication symptoms but not at risk of limb loss at that time.   Pt was to follow-up in 2 months with our nurse practitioner for  repeat ABIs.  He was to return sooner if he developed rest pain or nonhealing ulcers or more debilitating claudication symptoms.  He takes Plavix, aspirin, and a statin.  Past Medical History:  Diagnosis Date  . Arthritis    "back" (02/27/2017)  . Asthma   . Chronic back pain    "all over" (02/27/2017)  . Claudication (HCC) 12/09/2016   Bilateral LE  . Dyslipidemia 09/06/2015  . Fibromyalgia   . Neuromuscular disorder (HCC)    neuropathy  . PAD (peripheral artery disease) (HCC)     Past Surgical History:  Procedure Laterality Date  . ABDOMINAL AORTOGRAM W/LOWER EXTREMITY  02/27/2017  . ABDOMINAL AORTOGRAM W/LOWER EXTREMITY N/A 02/27/2017   Procedure: ABDOMINAL AORTOGRAM W/LOWER EXTREMITY;  Surgeon: Sherren KernsFields, Charles E, MD;  Location: [facility];  Service: Cardiovascular;  Laterality: N/A;  . ABDOMINAL AORTOGRAM W/LOWER EXTREMITY Right 03/13/2017   Procedure: ABDOMINAL AORTOGRAM W/LOWER EXTREMITY;  Surgeon: Sherren KernsFields, Charles E, MD;  Location: [facility];  Service: Cardiovascular;  Laterality: Right;  . ILIAC ARTERY STENT Left 02/27/2017   external iliac stent (self-expanding 6 x 40 mm)  . PERIPHERAL VASCULAR INTERVENTION Left 02/27/2017   Procedure: PERIPHERAL VASCULAR INTERVENTION;  Surgeon: Sherren KernsFields, Charles E, MD;  [facility];  Service: Cardiovascular;  Laterality: Left;  left external iliac  . PERIPHERAL VASCULAR INTERVENTION  03/13/2017   Procedure: PERIPHERAL VASCULAR INTERVENTION;  Surgeon: Sherren KernsFields, Charles E, MD;  [facility];  Service: Cardiovascular;;  RIGHT LOWER EXT  . TONSILLECTOMY  Current Meds  Medication Sig  . aspirin EC 325 MG tablet Take 325 mg by mouth daily.  Marland Kitchen. atorvastatin (LIPITOR) 40 MG tablet   . clopidogrel (PLAVIX) 75 MG tablet TAKE 1 TABLET ONCE DAILY  . gabapentin (NEURONTIN) 600 MG tablet Take 600 mg by mouth 3 (three) times daily.  . Melatonin 3 MG TABS Take 3 mg by mouth at bedtime.  . meloxicam (MOBIC)  15 MG tablet Take 15 mg by mouth daily.  . Omega-3 Fatty Acids (FISH OIL) 1200 MG CAPS Take 1,200 mg by mouth 2 (two) times daily.  . traMADol (ULTRAM) 50 MG tablet Take 50 mg by mouth 2 (two) times daily.    Current Outpatient Medications on File Prior to Visit  Medication Sig Dispense Refill  . aspirin EC 325 MG tablet Take 325 mg by mouth daily.    Marland Kitchen. atorvastatin (LIPITOR) 40 MG tablet     . clopidogrel (PLAVIX) 75 MG tablet TAKE 1 TABLET ONCE DAILY 30 tablet 11  . gabapentin (NEURONTIN) 600 MG tablet Take 600 mg by mouth 3 (three) times daily.    . Melatonin 3 MG TABS Take 3 mg by mouth at bedtime.    . meloxicam (MOBIC) 15 MG tablet Take 15 mg by mouth daily.    . Omega-3 Fatty Acids (FISH OIL) 1200 MG CAPS Take 1,200 mg by mouth 2 (two) times daily.    . traMADol (ULTRAM) 50 MG tablet Take 50 mg by mouth 2 (two) times daily.     Marland Kitchen. atorvastatin (LIPITOR) 20 MG tablet Take 20 mg by mouth daily.     No current facility-administered medications on file prior to visit.    Social History   Socioeconomic History  . Marital status: Married    Spouse name: Not on file  . Number of children: Not on file  . Years of education: Not on file  . Highest education level: Not on file  Occupational History  . Not on file  Social Needs  . Financial resource strain: Not on file  . Food insecurity    Worry: Not on file    Inability: Not on file  . Transportation needs    Medical: Not on file    Non-medical: Not on file  Tobacco Use  . Smoking status: Current Every Day Smoker    Packs/day: 1.50    Years: 39.00    Pack years: 58.50    Types: Cigarettes  . Smokeless tobacco: Never Used  Substance and Sexual Activity  . Alcohol use: No  . Drug use: No  . Sexual activity: Yes  Lifestyle  . Physical activity    Days per week: Not on file    Minutes per session: Not on file  . Stress: Not on file  Relationships  . Social Musicianconnections    Talks on phone: Not on file    Gets together:  Not on file    Attends religious service: Not on file    Active member of club or organization: Not on file    Attends meetings of clubs or organizations: Not on file    Relationship status: Not on file  . Intimate partner violence    Fear of current or ex partner: Not on file    Emotionally abused: Not on file    Physically abused: Not on file    Forced sexual activity: Not on file  Other Topics Concern  . Not on file  Social History Narrative  . Not on file  12 system ROS was negative unless otherwise noted in HPI  Observations/Objective: ABI (Date: 01-06-19): ABI Findings: +---------+------------------+-----+----------+--------+ Right    Rt Pressure (mmHg)IndexWaveform  Comment  +---------+------------------+-----+----------+--------+ Brachial 111                                       +---------+------------------+-----+----------+--------+ PTA      81                0.73 monophasic         +---------+------------------+-----+----------+--------+ DP       81                0.73 monophasic         +---------+------------------+-----+----------+--------+ Great Toe64                0.58                    +---------+------------------+-----+----------+--------+  +---------+------------------+-----+--------+-------+ Left     Lt Pressure (mmHg)IndexWaveformComment +---------+------------------+-----+--------+-------+ Brachial 93                                     +---------+------------------+-----+--------+-------+ PTA      107               0.96 biphasic        +---------+------------------+-----+--------+-------+ DP       107               0.96 biphasic        +---------+------------------+-----+--------+-------+ Great Toe73                0.66                 +---------+------------------+-----+--------+-------+  +-------+-----------+-----------+------------+------------+ ABI/TBIToday's ABIToday's  TBIPrevious ABIPrevious TBI +-------+-----------+-----------+------------+------------+ Right  0.73       0.58       0.64        0.47         +-------+-----------+-----------+------------+------------+ Left   0.96       0.66       0.99        0.63         +-------+-----------+-----------+------------+------------+ Summary: Right: Resting right ankle-brachial index indicates moderate right lower extremity arterial disease. The right toe-brachial index is abnormal.  Left: Resting left ankle-brachial index is within normal range. No evidence of significant left lower extremity arterial disease. The left toe-brachial index is abnormal.   Assessment and Plan: He has no claudication in his left LE. His right LE claudication remains about the same: calf cramps after walking 100-200 feet, resolves with rest. He denies any non healing wounds.   Follow Up Instructions:  Follow up 6 months with ABI's, and right lower extremity arterial duplex, see me or Dr. Oneida Alar afterward.    I discussed the assessment and treatment plan with the patient. The patient was provided an opportunity to ask questions and all were answered. The patient agreed with the plan and demonstrated an understanding of the instructions.   The patient was advised to call back or seek an in-person evaluation if the symptoms worsen or if the condition fails to improve as anticipated.  Over 3 minutes was spent counseling patient re smoking cessation, and patient was given several free resources re smoking cessation.  I spent 15 minutes with the patient  via phone encounter.   Donnalee CurrySigned, Suzanne Nickel [facility] Office: [phone]  01/10/2019, 1:38 PM

## 2019-01-10 NOTE — Patient Instructions (Signed)

## 2019-01-27 DIAGNOSIS — I739 Peripheral vascular disease, unspecified: Secondary | ICD-10-CM | POA: Diagnosis not present

## 2019-01-27 DIAGNOSIS — Z125 Encounter for screening for malignant neoplasm of prostate: Secondary | ICD-10-CM | POA: Diagnosis not present

## 2019-01-27 DIAGNOSIS — E785 Hyperlipidemia, unspecified: Secondary | ICD-10-CM | POA: Diagnosis not present

## 2019-01-27 DIAGNOSIS — G629 Polyneuropathy, unspecified: Secondary | ICD-10-CM | POA: Diagnosis not present

## 2019-01-27 DIAGNOSIS — M130 Polyarthritis, unspecified: Secondary | ICD-10-CM | POA: Diagnosis not present

## 2019-01-27 DIAGNOSIS — Z79899 Other long term (current) drug therapy: Secondary | ICD-10-CM | POA: Diagnosis not present

## 2019-03-10 ENCOUNTER — Other Ambulatory Visit: Payer: Self-pay | Admitting: Vascular Surgery

## 2019-04-12 DIAGNOSIS — X58XXXA Exposure to other specified factors, initial encounter: Secondary | ICD-10-CM | POA: Diagnosis not present

## 2019-04-12 DIAGNOSIS — S61011A Laceration without foreign body of right thumb without damage to nail, initial encounter: Secondary | ICD-10-CM | POA: Diagnosis not present

## 2019-04-12 DIAGNOSIS — Z23 Encounter for immunization: Secondary | ICD-10-CM | POA: Diagnosis not present

## 2019-04-12 DIAGNOSIS — S62521B Displaced fracture of distal phalanx of right thumb, initial encounter for open fracture: Secondary | ICD-10-CM | POA: Diagnosis not present

## 2019-04-12 DIAGNOSIS — S62521A Displaced fracture of distal phalanx of right thumb, initial encounter for closed fracture: Secondary | ICD-10-CM | POA: Diagnosis not present

## 2019-04-14 DIAGNOSIS — M79644 Pain in right finger(s): Secondary | ICD-10-CM | POA: Diagnosis not present

## 2019-04-14 DIAGNOSIS — S62521B Displaced fracture of distal phalanx of right thumb, initial encounter for open fracture: Secondary | ICD-10-CM | POA: Diagnosis not present

## 2019-04-17 IMAGING — CT CT ABD-PELV W/O CM
2 of 7 series · 15 of 46 positions shown, 17 images · non-contrast
Comparison: None.

CLINICAL DATA: Patient had aorta-bifemoral in OR, patient had right
femoral puncture for procedure. Swelling in the right groin with
ecchymosis

EXAM:
CT ABDOMEN AND PELVIS WITHOUT CONTRAST
TECHNIQUE: Multidetector CT imaging of the abdomen and pelvis was performed
following the standard protocol without IV contrast.

[Series 3: a/p w/o 5mm · axial · non-contrast · 0.85mm/px · z∈[+851,+1326]mm · 12 of 109 slices shown, 14 images]
[im 7/109  soft-tissue]
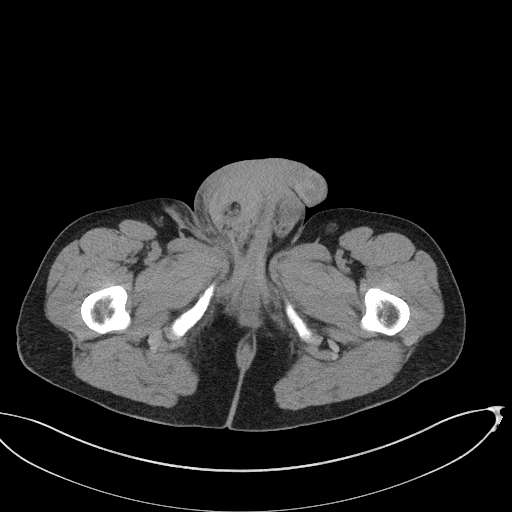
[im 7/109  bone]
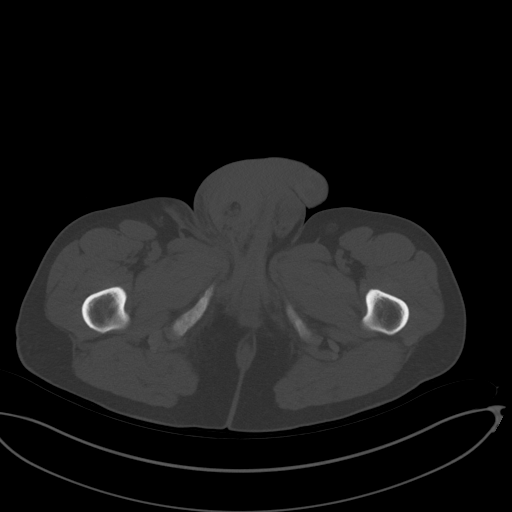
[im 20/109  soft-tissue]
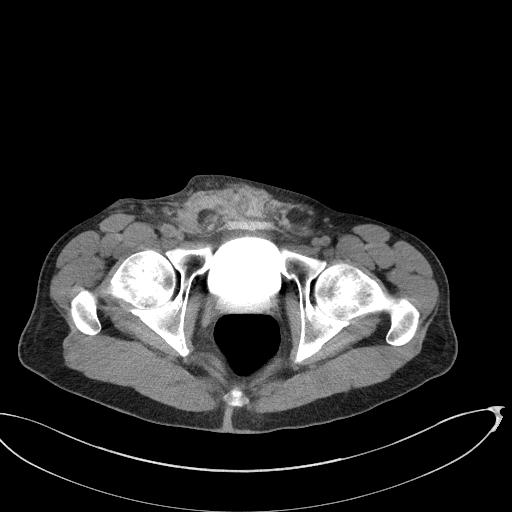
[im 26/109  soft-tissue]
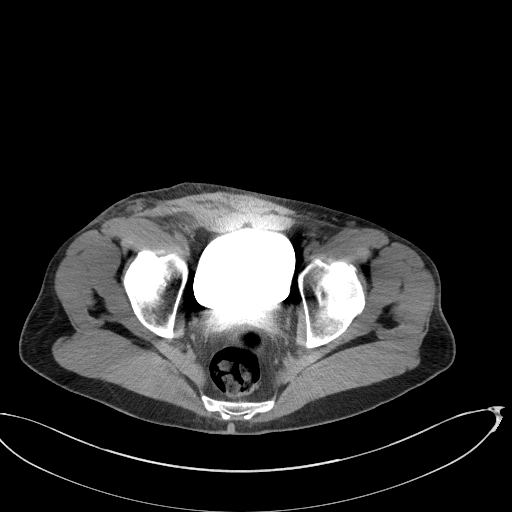
[im 32/109  soft-tissue]
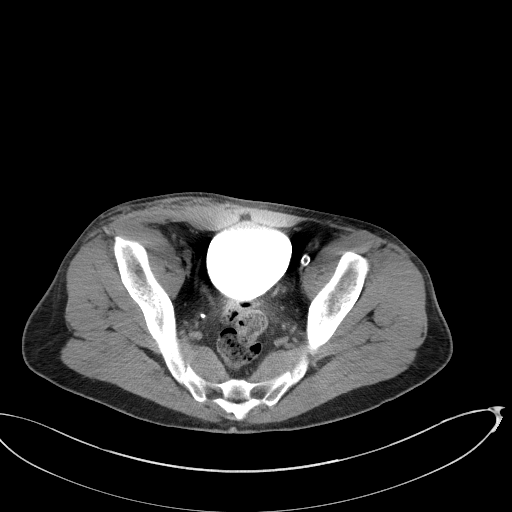
[im 45/109  soft-tissue]
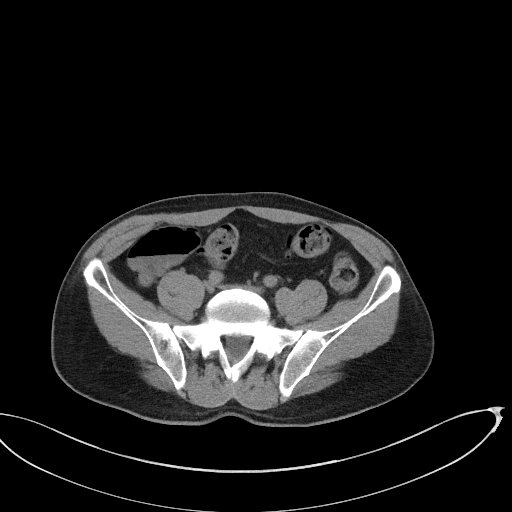
[im 51/109  soft-tissue]
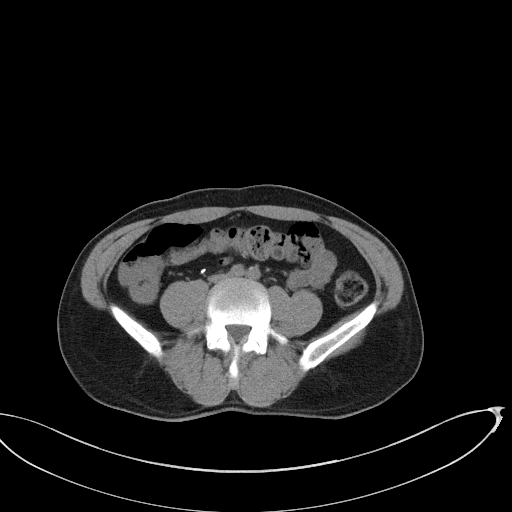
[im 58/109  soft-tissue]
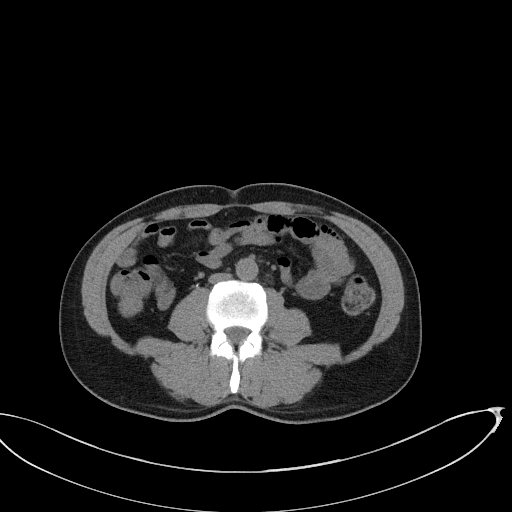
[im 70/109  soft-tissue]
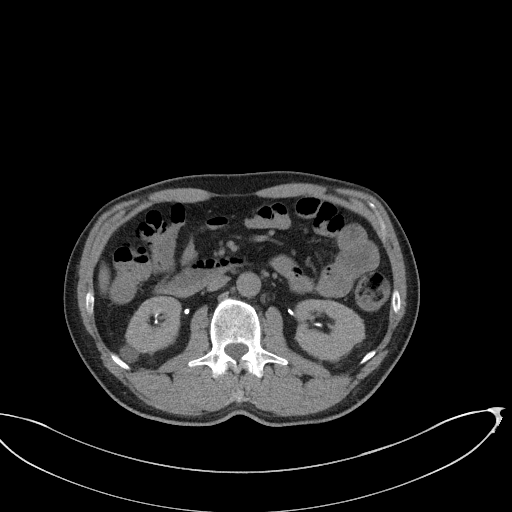
[im 77/109  soft-tissue]
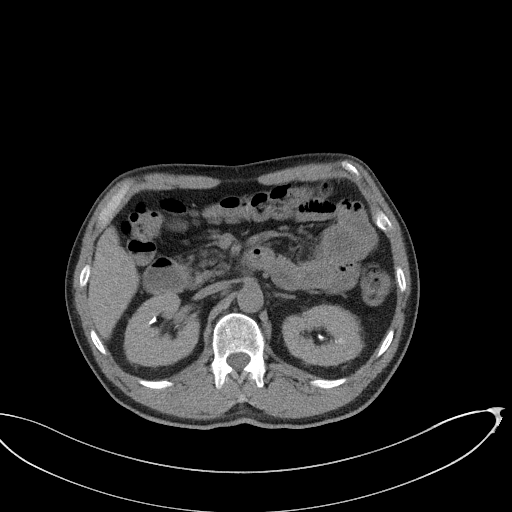
[im 77/109  bone]
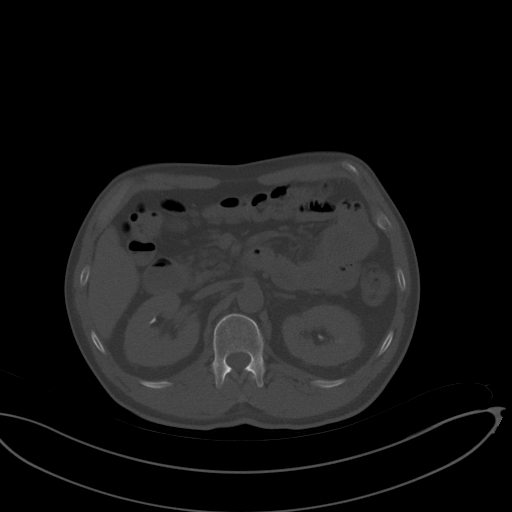
[im 83/109  soft-tissue]
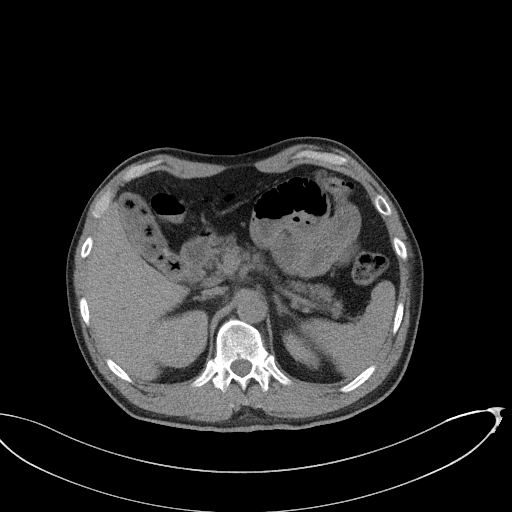
[im 96/109  soft-tissue]
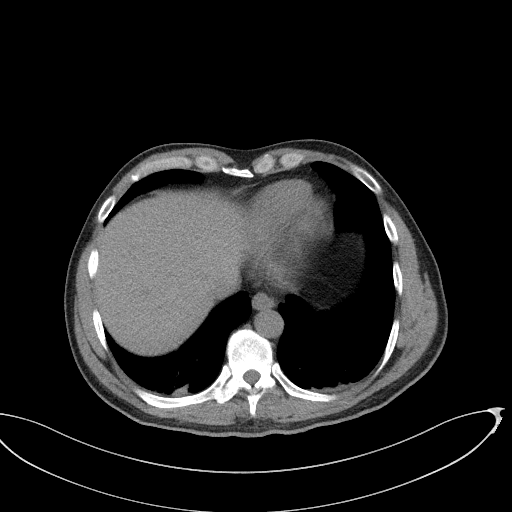
[im 102/109  soft-tissue]
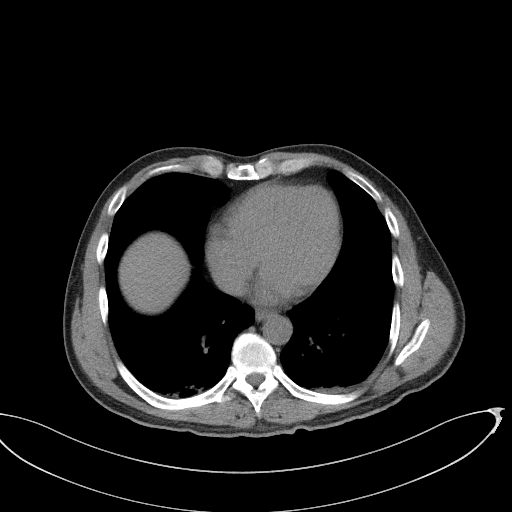

[Series 6: a/p w/o cor · coronal · non-contrast · 0.65mm/px · 3 of 101 slices shown]
[im 26/101  soft-tissue]
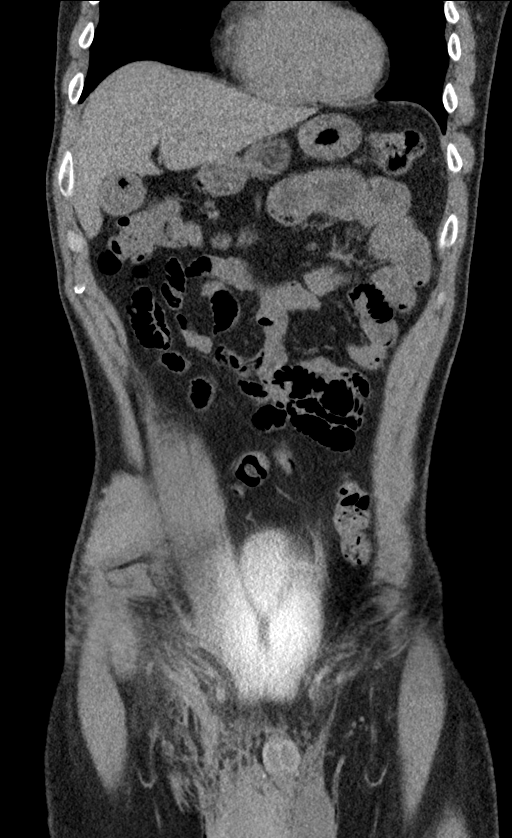
[im 51/101  soft-tissue]
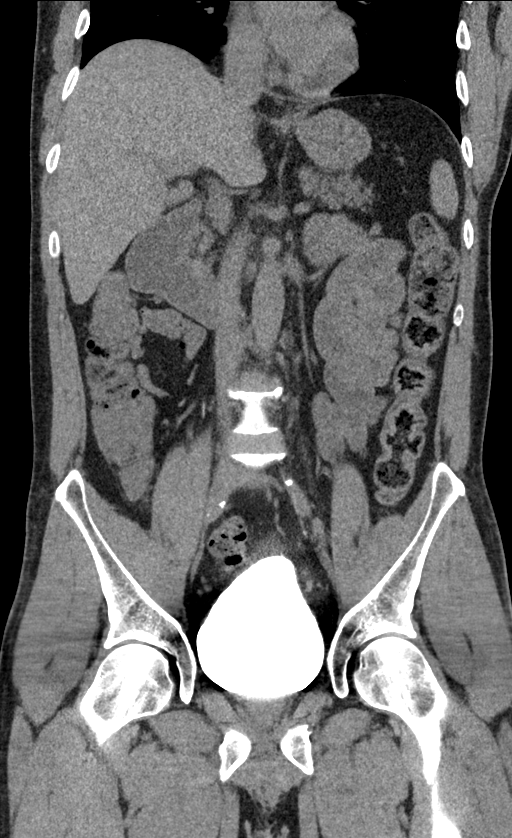
[im 76/101  soft-tissue]
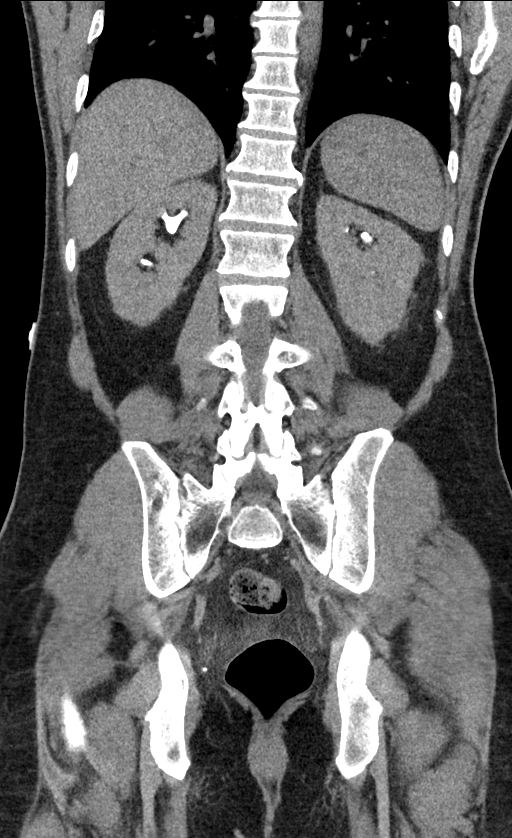

[15 of 46 positions shown; findings below may reference images not displayed]

FINDINGS: Lower chest: No acute abnormality.

Hepatobiliary: No focal liver abnormality is seen. Cholelithiasis.
No intrahepatic or extrahepatic biliary ductal dilatation.

Pancreas: Unremarkable. No pancreatic ductal dilatation or
surrounding inflammatory changes.

Spleen: Normal in size without focal abnormality.

Adrenals/Urinary Tract: Adrenal glands are unremarkable. Kidneys are
normal, without renal calculi, focal lesion, or hydronephrosis.
Bladder is unremarkable.

Stomach/Bowel: Stomach is within normal limits. Appendix appears
normal. No evidence of bowel wall thickening, distention, or
inflammatory changes.

Vascular/Lymphatic: Abdominal aorta is normal in caliber. High
density fluid in the subcutaneous fat of the right groin extending
into the scrotum most consistent with hemorrhage.

Reproductive: Prostate is unremarkable.

Other: No abdominal wall hernia or abnormality. No abdominopelvic
ascites.

Musculoskeletal: No acute or significant osseous findings.
IMPRESSION: High density fluid in the subcutaneous fat of the right groin
extending into the pre pubic region hand scrotum most consistent
with hemorrhage. Largest area of hemorrhage measures 8.8 x 2.4 cm.
No retroperitoneal hemorrhage.

## 2019-04-28 DIAGNOSIS — S62521B Displaced fracture of distal phalanx of right thumb, initial encounter for open fracture: Secondary | ICD-10-CM | POA: Diagnosis not present

## 2019-04-29 DIAGNOSIS — Z23 Encounter for immunization: Secondary | ICD-10-CM | POA: Diagnosis not present

## 2019-04-29 DIAGNOSIS — I739 Peripheral vascular disease, unspecified: Secondary | ICD-10-CM | POA: Diagnosis not present

## 2019-04-29 DIAGNOSIS — M130 Polyarthritis, unspecified: Secondary | ICD-10-CM | POA: Diagnosis not present

## 2019-04-29 DIAGNOSIS — R21 Rash and other nonspecific skin eruption: Secondary | ICD-10-CM | POA: Diagnosis not present

## 2019-04-29 DIAGNOSIS — G629 Polyneuropathy, unspecified: Secondary | ICD-10-CM | POA: Diagnosis not present

## 2019-08-01 DIAGNOSIS — M797 Fibromyalgia: Secondary | ICD-10-CM | POA: Diagnosis not present

## 2019-08-01 DIAGNOSIS — G4700 Insomnia, unspecified: Secondary | ICD-10-CM | POA: Diagnosis not present

## 2019-08-01 DIAGNOSIS — Z1331 Encounter for screening for depression: Secondary | ICD-10-CM | POA: Diagnosis not present

## 2019-08-01 DIAGNOSIS — M130 Polyarthritis, unspecified: Secondary | ICD-10-CM | POA: Diagnosis not present

## 2019-08-01 DIAGNOSIS — Z720 Tobacco use: Secondary | ICD-10-CM | POA: Diagnosis not present

## 2019-08-01 DIAGNOSIS — Z79899 Other long term (current) drug therapy: Secondary | ICD-10-CM | POA: Diagnosis not present

## 2019-08-01 DIAGNOSIS — E785 Hyperlipidemia, unspecified: Secondary | ICD-10-CM | POA: Diagnosis not present

## 2019-08-01 DIAGNOSIS — I739 Peripheral vascular disease, unspecified: Secondary | ICD-10-CM | POA: Diagnosis not present

## 2019-10-31 DIAGNOSIS — G4700 Insomnia, unspecified: Secondary | ICD-10-CM | POA: Diagnosis not present

## 2019-10-31 DIAGNOSIS — E785 Hyperlipidemia, unspecified: Secondary | ICD-10-CM | POA: Diagnosis not present

## 2019-10-31 DIAGNOSIS — M797 Fibromyalgia: Secondary | ICD-10-CM | POA: Diagnosis not present

## 2019-10-31 DIAGNOSIS — M130 Polyarthritis, unspecified: Secondary | ICD-10-CM | POA: Diagnosis not present

## 2019-11-23 DIAGNOSIS — Z1211 Encounter for screening for malignant neoplasm of colon: Secondary | ICD-10-CM | POA: Diagnosis not present

## 2020-02-05 DIAGNOSIS — M5442 Lumbago with sciatica, left side: Secondary | ICD-10-CM | POA: Diagnosis not present

## 2020-02-05 DIAGNOSIS — M545 Low back pain: Secondary | ICD-10-CM | POA: Diagnosis not present

## 2020-02-07 DIAGNOSIS — Z681 Body mass index (BMI) 19 or less, adult: Secondary | ICD-10-CM | POA: Diagnosis not present

## 2020-02-07 DIAGNOSIS — E785 Hyperlipidemia, unspecified: Secondary | ICD-10-CM | POA: Diagnosis not present

## 2020-02-07 DIAGNOSIS — M130 Polyarthritis, unspecified: Secondary | ICD-10-CM | POA: Diagnosis not present

## 2020-02-07 DIAGNOSIS — Z0000 Encounter for general adult medical examination without abnormal findings: Secondary | ICD-10-CM | POA: Diagnosis not present

## 2020-03-07 ENCOUNTER — Other Ambulatory Visit: Payer: Self-pay | Admitting: Vascular Surgery

## 2020-03-07 NOTE — Telephone Encounter (Signed)
Pt will need to be seen in office for more refills.  °

## 2020-03-20 DIAGNOSIS — S61210A Laceration without foreign body of right index finger without damage to nail, initial encounter: Secondary | ICD-10-CM | POA: Diagnosis not present

## 2020-04-05 ENCOUNTER — Other Ambulatory Visit: Payer: Self-pay | Admitting: Vascular Surgery

## 2020-04-17 DIAGNOSIS — R509 Fever, unspecified: Secondary | ICD-10-CM | POA: Diagnosis not present

## 2020-04-17 DIAGNOSIS — R051 Acute cough: Secondary | ICD-10-CM | POA: Diagnosis not present

## 2020-04-17 DIAGNOSIS — Z20828 Contact with and (suspected) exposure to other viral communicable diseases: Secondary | ICD-10-CM | POA: Diagnosis not present

## 2020-04-17 DIAGNOSIS — R0981 Nasal congestion: Secondary | ICD-10-CM | POA: Diagnosis not present

## 2020-04-26 DIAGNOSIS — R5382 Chronic fatigue, unspecified: Secondary | ICD-10-CM | POA: Diagnosis not present

## 2020-04-26 DIAGNOSIS — Z20828 Contact with and (suspected) exposure to other viral communicable diseases: Secondary | ICD-10-CM | POA: Diagnosis not present

## 2020-05-01 DIAGNOSIS — M79621 Pain in right upper arm: Secondary | ICD-10-CM | POA: Diagnosis not present

## 2020-05-11 DIAGNOSIS — M130 Polyarthritis, unspecified: Secondary | ICD-10-CM | POA: Diagnosis not present

## 2020-05-11 DIAGNOSIS — G4700 Insomnia, unspecified: Secondary | ICD-10-CM | POA: Diagnosis not present

## 2020-05-11 DIAGNOSIS — Z23 Encounter for immunization: Secondary | ICD-10-CM | POA: Diagnosis not present

## 2020-05-11 DIAGNOSIS — E785 Hyperlipidemia, unspecified: Secondary | ICD-10-CM | POA: Diagnosis not present

## 2020-05-11 DIAGNOSIS — R7989 Other specified abnormal findings of blood chemistry: Secondary | ICD-10-CM | POA: Diagnosis not present

## 2020-06-25 ENCOUNTER — Other Ambulatory Visit: Payer: Self-pay | Admitting: Vascular Surgery

## 2020-08-10 DIAGNOSIS — I739 Peripheral vascular disease, unspecified: Secondary | ICD-10-CM | POA: Diagnosis not present

## 2020-08-10 DIAGNOSIS — Z79899 Other long term (current) drug therapy: Secondary | ICD-10-CM | POA: Diagnosis not present

## 2020-08-10 DIAGNOSIS — G4700 Insomnia, unspecified: Secondary | ICD-10-CM | POA: Diagnosis not present

## 2020-08-10 DIAGNOSIS — M130 Polyarthritis, unspecified: Secondary | ICD-10-CM | POA: Diagnosis not present

## 2020-08-10 DIAGNOSIS — E785 Hyperlipidemia, unspecified: Secondary | ICD-10-CM | POA: Diagnosis not present

## 2020-10-09 DIAGNOSIS — R051 Acute cough: Secondary | ICD-10-CM | POA: Diagnosis not present

## 2020-10-09 DIAGNOSIS — J069 Acute upper respiratory infection, unspecified: Secondary | ICD-10-CM | POA: Diagnosis not present

## 2020-10-09 DIAGNOSIS — R0981 Nasal congestion: Secondary | ICD-10-CM | POA: Diagnosis not present

## 2020-11-19 DIAGNOSIS — J111 Influenza due to unidentified influenza virus with other respiratory manifestations: Secondary | ICD-10-CM | POA: Diagnosis not present

## 2020-11-26 DIAGNOSIS — G4700 Insomnia, unspecified: Secondary | ICD-10-CM | POA: Diagnosis not present

## 2020-11-26 DIAGNOSIS — B070 Plantar wart: Secondary | ICD-10-CM | POA: Diagnosis not present

## 2020-11-26 DIAGNOSIS — I739 Peripheral vascular disease, unspecified: Secondary | ICD-10-CM | POA: Diagnosis not present

## 2020-11-26 DIAGNOSIS — Z720 Tobacco use: Secondary | ICD-10-CM | POA: Diagnosis not present

## 2020-11-26 DIAGNOSIS — M130 Polyarthritis, unspecified: Secondary | ICD-10-CM | POA: Diagnosis not present

## 2020-11-26 DIAGNOSIS — Z1331 Encounter for screening for depression: Secondary | ICD-10-CM | POA: Diagnosis not present

## 2021-02-27 DIAGNOSIS — Z125 Encounter for screening for malignant neoplasm of prostate: Secondary | ICD-10-CM | POA: Diagnosis not present

## 2021-02-27 DIAGNOSIS — G629 Polyneuropathy, unspecified: Secondary | ICD-10-CM | POA: Diagnosis not present

## 2021-02-27 DIAGNOSIS — G4700 Insomnia, unspecified: Secondary | ICD-10-CM | POA: Diagnosis not present

## 2021-02-27 DIAGNOSIS — Z79899 Other long term (current) drug therapy: Secondary | ICD-10-CM | POA: Diagnosis not present

## 2021-02-27 DIAGNOSIS — E785 Hyperlipidemia, unspecified: Secondary | ICD-10-CM | POA: Diagnosis not present

## 2021-02-27 DIAGNOSIS — M797 Fibromyalgia: Secondary | ICD-10-CM | POA: Diagnosis not present

## 2021-06-10 DIAGNOSIS — G4700 Insomnia, unspecified: Secondary | ICD-10-CM | POA: Diagnosis not present

## 2021-06-10 DIAGNOSIS — Z23 Encounter for immunization: Secondary | ICD-10-CM | POA: Diagnosis not present

## 2021-06-10 DIAGNOSIS — E785 Hyperlipidemia, unspecified: Secondary | ICD-10-CM | POA: Diagnosis not present

## 2021-06-10 DIAGNOSIS — M797 Fibromyalgia: Secondary | ICD-10-CM | POA: Diagnosis not present

## 2021-09-16 DIAGNOSIS — Z6822 Body mass index (BMI) 22.0-22.9, adult: Secondary | ICD-10-CM | POA: Diagnosis not present

## 2021-09-16 DIAGNOSIS — Z0000 Encounter for general adult medical examination without abnormal findings: Secondary | ICD-10-CM | POA: Diagnosis not present

## 2022-01-02 DIAGNOSIS — E785 Hyperlipidemia, unspecified: Secondary | ICD-10-CM | POA: Diagnosis not present

## 2022-01-02 DIAGNOSIS — M130 Polyarthritis, unspecified: Secondary | ICD-10-CM | POA: Diagnosis not present

## 2022-01-02 DIAGNOSIS — Z87891 Personal history of nicotine dependence: Secondary | ICD-10-CM | POA: Diagnosis not present

## 2022-01-02 DIAGNOSIS — G629 Polyneuropathy, unspecified: Secondary | ICD-10-CM | POA: Diagnosis not present

## 2022-01-02 DIAGNOSIS — Z1331 Encounter for screening for depression: Secondary | ICD-10-CM | POA: Diagnosis not present

## 2022-04-04 DIAGNOSIS — Z125 Encounter for screening for malignant neoplasm of prostate: Secondary | ICD-10-CM | POA: Diagnosis not present

## 2022-04-04 DIAGNOSIS — M130 Polyarthritis, unspecified: Secondary | ICD-10-CM | POA: Diagnosis not present

## 2022-04-04 DIAGNOSIS — Z79899 Other long term (current) drug therapy: Secondary | ICD-10-CM | POA: Diagnosis not present

## 2022-04-04 DIAGNOSIS — E785 Hyperlipidemia, unspecified: Secondary | ICD-10-CM | POA: Diagnosis not present

## 2022-04-04 DIAGNOSIS — I739 Peripheral vascular disease, unspecified: Secondary | ICD-10-CM | POA: Diagnosis not present

## 2022-04-04 DIAGNOSIS — G629 Polyneuropathy, unspecified: Secondary | ICD-10-CM | POA: Diagnosis not present

## 2022-06-23 DIAGNOSIS — H9202 Otalgia, left ear: Secondary | ICD-10-CM | POA: Diagnosis not present

## 2022-06-23 DIAGNOSIS — H6692 Otitis media, unspecified, left ear: Secondary | ICD-10-CM | POA: Diagnosis not present

## 2022-07-11 DIAGNOSIS — G629 Polyneuropathy, unspecified: Secondary | ICD-10-CM | POA: Diagnosis not present

## 2022-07-11 DIAGNOSIS — E785 Hyperlipidemia, unspecified: Secondary | ICD-10-CM | POA: Diagnosis not present

## 2022-07-11 DIAGNOSIS — M130 Polyarthritis, unspecified: Secondary | ICD-10-CM | POA: Diagnosis not present

## 2022-07-11 DIAGNOSIS — R0981 Nasal congestion: Secondary | ICD-10-CM | POA: Diagnosis not present

## 2022-07-17 DIAGNOSIS — R739 Hyperglycemia, unspecified: Secondary | ICD-10-CM | POA: Diagnosis not present

## 2022-07-17 DIAGNOSIS — E785 Hyperlipidemia, unspecified: Secondary | ICD-10-CM | POA: Diagnosis not present

## 2022-07-17 DIAGNOSIS — Z79899 Other long term (current) drug therapy: Secondary | ICD-10-CM | POA: Diagnosis not present

## 2022-10-17 DIAGNOSIS — R739 Hyperglycemia, unspecified: Secondary | ICD-10-CM | POA: Diagnosis not present

## 2022-10-17 DIAGNOSIS — M130 Polyarthritis, unspecified: Secondary | ICD-10-CM | POA: Diagnosis not present

## 2022-10-17 DIAGNOSIS — G629 Polyneuropathy, unspecified: Secondary | ICD-10-CM | POA: Diagnosis not present

## 2022-10-17 DIAGNOSIS — E785 Hyperlipidemia, unspecified: Secondary | ICD-10-CM | POA: Diagnosis not present

## 2022-10-17 DIAGNOSIS — Z79899 Other long term (current) drug therapy: Secondary | ICD-10-CM | POA: Diagnosis not present

## 2023-01-20 DIAGNOSIS — Z79899 Other long term (current) drug therapy: Secondary | ICD-10-CM | POA: Diagnosis not present

## 2023-01-20 DIAGNOSIS — G629 Polyneuropathy, unspecified: Secondary | ICD-10-CM | POA: Diagnosis not present

## 2023-01-20 DIAGNOSIS — M130 Polyarthritis, unspecified: Secondary | ICD-10-CM | POA: Diagnosis not present

## 2023-01-20 DIAGNOSIS — R739 Hyperglycemia, unspecified: Secondary | ICD-10-CM | POA: Diagnosis not present

## 2023-01-20 DIAGNOSIS — E785 Hyperlipidemia, unspecified: Secondary | ICD-10-CM | POA: Diagnosis not present

## 2023-04-23 DIAGNOSIS — G629 Polyneuropathy, unspecified: Secondary | ICD-10-CM | POA: Diagnosis not present

## 2023-04-23 DIAGNOSIS — I739 Peripheral vascular disease, unspecified: Secondary | ICD-10-CM | POA: Diagnosis not present

## 2023-04-23 DIAGNOSIS — Z79899 Other long term (current) drug therapy: Secondary | ICD-10-CM | POA: Diagnosis not present

## 2023-04-23 DIAGNOSIS — R739 Hyperglycemia, unspecified: Secondary | ICD-10-CM | POA: Diagnosis not present

## 2023-04-23 DIAGNOSIS — E785 Hyperlipidemia, unspecified: Secondary | ICD-10-CM | POA: Diagnosis not present

## 2023-04-23 DIAGNOSIS — Z87891 Personal history of nicotine dependence: Secondary | ICD-10-CM | POA: Diagnosis not present

## 2023-04-23 DIAGNOSIS — Z125 Encounter for screening for malignant neoplasm of prostate: Secondary | ICD-10-CM | POA: Diagnosis not present

## 2023-04-23 DIAGNOSIS — Z23 Encounter for immunization: Secondary | ICD-10-CM | POA: Diagnosis not present

## 2023-07-24 DIAGNOSIS — R739 Hyperglycemia, unspecified: Secondary | ICD-10-CM | POA: Diagnosis not present

## 2023-07-24 DIAGNOSIS — G629 Polyneuropathy, unspecified: Secondary | ICD-10-CM | POA: Diagnosis not present

## 2023-07-24 DIAGNOSIS — E785 Hyperlipidemia, unspecified: Secondary | ICD-10-CM | POA: Diagnosis not present

## 2023-07-24 DIAGNOSIS — Z79899 Other long term (current) drug therapy: Secondary | ICD-10-CM | POA: Diagnosis not present

## 2023-07-24 DIAGNOSIS — M25552 Pain in left hip: Secondary | ICD-10-CM | POA: Diagnosis not present

## 2023-07-30 DIAGNOSIS — M1612 Unilateral primary osteoarthritis, left hip: Secondary | ICD-10-CM | POA: Diagnosis not present

## 2023-08-22 DIAGNOSIS — Z87898 Personal history of other specified conditions: Secondary | ICD-10-CM | POA: Diagnosis not present

## 2023-08-22 DIAGNOSIS — K219 Gastro-esophageal reflux disease without esophagitis: Secondary | ICD-10-CM | POA: Diagnosis not present

## 2023-08-22 DIAGNOSIS — R053 Chronic cough: Secondary | ICD-10-CM | POA: Diagnosis not present

## 2023-10-21 DIAGNOSIS — J45909 Unspecified asthma, uncomplicated: Secondary | ICD-10-CM | POA: Diagnosis not present

## 2023-10-21 DIAGNOSIS — R739 Hyperglycemia, unspecified: Secondary | ICD-10-CM | POA: Diagnosis not present

## 2023-10-21 DIAGNOSIS — G629 Polyneuropathy, unspecified: Secondary | ICD-10-CM | POA: Diagnosis not present

## 2023-10-21 DIAGNOSIS — Z79899 Other long term (current) drug therapy: Secondary | ICD-10-CM | POA: Diagnosis not present

## 2023-10-21 DIAGNOSIS — E785 Hyperlipidemia, unspecified: Secondary | ICD-10-CM | POA: Diagnosis not present

## 2023-12-13 DIAGNOSIS — Z5321 Procedure and treatment not carried out due to patient leaving prior to being seen by health care provider: Secondary | ICD-10-CM | POA: Diagnosis not present

## 2023-12-13 DIAGNOSIS — R109 Unspecified abdominal pain: Secondary | ICD-10-CM | POA: Diagnosis not present

## 2023-12-17 DIAGNOSIS — H6122 Impacted cerumen, left ear: Secondary | ICD-10-CM | POA: Diagnosis not present

## 2023-12-17 DIAGNOSIS — H9202 Otalgia, left ear: Secondary | ICD-10-CM | POA: Diagnosis not present

## 2023-12-17 DIAGNOSIS — H60332 Swimmer's ear, left ear: Secondary | ICD-10-CM | POA: Diagnosis not present

## 2024-01-22 DIAGNOSIS — E785 Hyperlipidemia, unspecified: Secondary | ICD-10-CM | POA: Diagnosis not present

## 2024-01-22 DIAGNOSIS — G629 Polyneuropathy, unspecified: Secondary | ICD-10-CM | POA: Diagnosis not present

## 2024-01-22 DIAGNOSIS — Z79899 Other long term (current) drug therapy: Secondary | ICD-10-CM | POA: Diagnosis not present

## 2024-01-22 DIAGNOSIS — R739 Hyperglycemia, unspecified: Secondary | ICD-10-CM | POA: Diagnosis not present

## 2024-01-22 DIAGNOSIS — J45909 Unspecified asthma, uncomplicated: Secondary | ICD-10-CM | POA: Diagnosis not present

## 2024-03-23 DIAGNOSIS — J069 Acute upper respiratory infection, unspecified: Secondary | ICD-10-CM | POA: Diagnosis not present

## 2024-03-23 DIAGNOSIS — R0981 Nasal congestion: Secondary | ICD-10-CM | POA: Diagnosis not present

## 2024-04-01 DIAGNOSIS — J324 Chronic pansinusitis: Secondary | ICD-10-CM | POA: Diagnosis not present

## 2024-04-27 DIAGNOSIS — R739 Hyperglycemia, unspecified: Secondary | ICD-10-CM | POA: Diagnosis not present

## 2024-04-27 DIAGNOSIS — E785 Hyperlipidemia, unspecified: Secondary | ICD-10-CM | POA: Diagnosis not present

## 2024-04-27 DIAGNOSIS — J45909 Unspecified asthma, uncomplicated: Secondary | ICD-10-CM | POA: Diagnosis not present

## 2024-04-27 DIAGNOSIS — Z23 Encounter for immunization: Secondary | ICD-10-CM | POA: Diagnosis not present

## 2024-04-27 DIAGNOSIS — Z79899 Other long term (current) drug therapy: Secondary | ICD-10-CM | POA: Diagnosis not present

## 2024-04-27 DIAGNOSIS — Z125 Encounter for screening for malignant neoplasm of prostate: Secondary | ICD-10-CM | POA: Diagnosis not present

## 2024-04-27 DIAGNOSIS — G629 Polyneuropathy, unspecified: Secondary | ICD-10-CM | POA: Diagnosis not present
